# Patient Record
Sex: Female | Born: 2000 | Race: White | Hispanic: No | Marital: Single | Smoking: Never smoker
Health system: Southern US, Community
[De-identification: ages and names within clinical notes are randomized; demographics above are authoritative.]

## PROBLEM LIST (undated history)

## (undated) DIAGNOSIS — R569 Unspecified convulsions: Secondary | ICD-10-CM

## (undated) HISTORY — DX: Unspecified convulsions: R56.9

## (undated) HISTORY — PX: NO PAST SURGERIES: SHX2092

---
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
## 2021-06-09 ENCOUNTER — Encounter: Payer: Self-pay | Admitting: Neurology

## 2021-06-09 ENCOUNTER — Telehealth: Payer: Self-pay

## 2021-06-09 NOTE — Telephone Encounter (Signed)
REFERRAL FROM DR Hal Hope [phone] REFERRAL SENT TO SCHEDULING

## 2021-07-31 ENCOUNTER — Encounter: Payer: Self-pay | Admitting: Neurology

## 2021-07-31 ENCOUNTER — Ambulatory Visit: Payer: PRIVATE HEALTH INSURANCE | Admitting: Neurology

## 2021-07-31 ENCOUNTER — Other Ambulatory Visit: Payer: Self-pay

## 2021-07-31 VITALS — BP 130/67 | HR 77 | Ht 60.0 in | Wt 104.6 lb

## 2021-07-31 DIAGNOSIS — G40309 Generalized idiopathic epilepsy and epileptic syndromes, not intractable, without status epilepticus: Secondary | ICD-10-CM | POA: Diagnosis not present

## 2021-07-31 NOTE — Patient Instructions (Addendum)
Schedule MRI brain with and without contrast  2. Schedule 1-hour EEG  3. I will call you with results and we will discuss medication (Lamictal)  4. Follow-up in 3 months, call for any changes   Seizure Precautions: 1. If medication has been prescribed for you to prevent seizures, take it exactly as directed.  Do not stop taking the medicine without talking to your doctor first, even if you have not had a seizure in a long time.   2. Avoid activities in which a seizure would cause danger to yourself or to others.  Don't operate dangerous machinery, swim alone, or climb in high or dangerous places, such as on ladders, roofs, or girders.  Do not drive unless your doctor says you may.  3. If you have any warning that you may have a seizure, lay down in a safe place where you can't hurt yourself.    4.  No driving for 6 months from last seizure, as per [facility].   Please refer to the following link on the [employer]ile[employer] of [employer]'s website for more information: http://www.epilepsyfoundation.org/answerplace/Social/driving/drivingu.cfm   5.  Maintain good sleep hygiene. Avoid alcohol.  6.  Notify your neurology if you are planning pregnancy or if you become pregnant.  7.  Contact your doctor if you have any problems that may be related to the medicine you are taking.  8.  Call 911 and bring the patient back to the ED if:        A.  The seizure lasts longer than 5 minutes.       B.  The patient doesn't awaken shortly after the seizure  C.  The patient has new problems such as difficulty seeing, speaking or moving  D.  The patient was injured during the seizure  E.  The patient has a temperature over 102 F (39C)  F.  The patient vomited and now is having trouble breathing       We have sent a referral to [facility] for your MRI and they will call you directly to schedule your appointment. They are located at [address] [facility]. If you need to contact them  directly please call [phone].

## 2021-07-31 NOTE — Progress Notes (Signed)
NEUROLOGY CONSULTATION NOTE  Michele Mclaughlin MRN: [id number] DOB: 11/27/2021  Referring provider: Dr. Nadyne Coombes Primary care provider: Dr. Nadyne Coombes  Reason for consult:  syncope/seizure  Dear Dr Hal Hope:  Thank you for your kind referral of Michele Mclaughlin for consultation of the above symptoms. Although her history is well known to you, please allow me to reiterate it for the purpose of our medical record. She is alone in the office today. Records and images were personally reviewed where available.   HISTORY OF PRESENT ILLNESS: This is a pleasant 20 year old right-handed woman with a history of seizures and syncope presenting for evaluation. She was previously evaluated by pediatric neurologist Dr. Devonne Doughty for seizure-like activity. The first episode occurred in April 2019 when her mother heard a nosie from her room and found her confused and disoriented for 2 minutes, no tonic-clonic activity noted. The second episode occurred June 2019 when again her parents heard a noise and found her on the floor with tongue bite and confusion for about 2 minutes, no motor activity noted. She had another event in July 2019 where she was witnessed to have behavioral arrest then became tight and stiff, not responding so they lay her on the floor where she was noted to have stiffening more on the left arm with flexion of both upper extremities and jaw stiffening for 4-5 minutes followed by confusion. She had an EEG in July 2019 with note of 1 very brief cluster of generalized discharges in wakefulness. Repeat EEG that month showed 2 brief clusters of generalized discharges in the form of spike and slow wave activity lasting less than 1 second. She was prescribed Keppra but did not take it. She was event-free until January 2022 when she recalls taking a shower early in the morning then waking up sitting on the carpet with blood, confused. She found a gash on her head that required staples. The most  recent episode occurred on 06/19/21, she was in her bedroom then woke up in her parents' bedroom, apparently her brother found her passed out on the floor, no tongue bite or incontinence. She was a little stiff, no focal weakness, headaches, dizziness. The day prior was very stressful for her, her grandmother had passed away and her sister had given birth. She lives with her parents and 2 brothers who have not mentioned any staring/unresponsive episodes. She denies any gaps in time, olfactory/gustatory hallucinations, deja vu, rising epigastric sensation, focal numbness/tingling/weakness, myoclonic jerks.No headaches, dizziness, diplopia, dysarthria/dysphagia, neck/back pain, bowel/bladder dysfunction. Memory is pretty good. She had been evaluated by Cardiologist with plans for workup, however she is seeking a second opinion. 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   Epilepsy Risk Factors:  She had a normal birth and early development.  There is no history of febrile convulsions, CNS infections such as meningitis/encephalitis, significant traumatic brain injury, neurosurgical procedures, or family history of seizures.   PAST MEDICAL HISTORY: History reviewed. No pertinent past medical history.  PAST SURGICAL HISTORY: Past Surgical History:  Procedure Laterality Date   NO PAST SURGERIES      MEDICATIONS: Current Outpatient Medications on File Prior to Visit  Medication Sig Dispense Refill   Biotin 10 MG TABS Take by mouth.     No current facility-administered medications on file prior to visit.    ALLERGIES: No Known Allergies  FAMILY HISTORY: Family History  Problem Relation Age of Onset   Migraines Neg Hx    Seizures  Neg Hx    Autism Neg Hx    ADD / ADHD Neg Hx    Anxiety disorder Neg Hx    Depression Neg Hx    Bipolar disorder Neg Hx    Schizophrenia Neg Hx     SOCIAL HISTORY: Social History   Socioeconomic History   Marital status: Single    Spouse name: Not  on file   Number of children: Not on file   Years of education: Not on file   Highest education level: Not on file  Occupational History   Not on file  Tobacco Use   Smoking status: Never   Smokeless tobacco: Never  Substance and Sexual Activity   Alcohol use: Never   Drug use: Never   Sexual activity: Not on file  Other Topics Concern   Not on file  Social History Narrative   Lives with mom, dad, two brothers. She is going into the 11th grade and is home schooled. She enjoys baby sitting, running, and swimming   Right handed    Caffeine   Social Determinants of Health   Financial Resource Strain: Not on file  Food Insecurity: Not on file  Transportation Needs: Not on file  Physical Activity: Not on file  Stress: Not on file  Social Connections: Not on file  Intimate Partner Violence: Not on file     PHYSICAL EXAM: Vitals:   07/31/21 0855  BP: 130/67  Pulse: 77  SpO2: 99%   General: No acute distress Head:  Normocephalic/atraumatic Skin/Extremities: No rash, no edema Neurological Exam: Mental status: alert and oriented to person, place, and time, no dysarthria or aphasia, Fund of knowledge is appropriate.  Recent and remote memory are intact.  Attention and concentration are normal.     Cranial nerves: CN I: not tested CN II: pupils equal, round and reactive to light, visual fields intact CN III, IV, VI:  full range of motion, no nystagmus, no ptosis CN V: facial sensation intact CN VII: upper and lower face symmetric CN VIII: hearing intact to conversation Bulk & Tone: normal, no fasciculations. Motor: 5/5 throughout with no pronator drift. Sensation: intact to light touch, cold, pin, vibration and joint position sense.  No extinction to double simultaneous stimulation.  Romberg test negative Deep Tendon Reflexes: +2 throughout Cerebellar: no incoordination on finger to nose testing Gait: narrow-based and steady, able to tandem walk adequately. Tremor:  none   IMPRESSION: This is a 20 year old right-handed woman with a history of recurrent episodes of loss of consciousness concerning for seizures. She has had 2 prior EEGs in 2019 reporting generalized epileptiform discharges. MRI brain with and without contrast and 1-hour EEG will be ordered to classify seizures. Discussed low threshold to start medication, she would like to wait for results first before considering starting seizure medication such as Lamotrigine. [place] driving laws were discussed with the patient, and she knows to stop driving after a seizure, until 6 months seizure-free. Follow-up in 3 months, call for any changes.    Thank you for allowing me to participate in the care of this patient. Please do not hesitate to call for any questions or concerns.   Patrcia Dolly, M.D.  CC: Dr. Hal Hope

## 2021-08-13 ENCOUNTER — Ambulatory Visit: Payer: PRIVATE HEALTH INSURANCE | Admitting: Neurology

## 2021-08-13 ENCOUNTER — Other Ambulatory Visit: Payer: Self-pay

## 2021-08-13 DIAGNOSIS — G40309 Generalized idiopathic epilepsy and epileptic syndromes, not intractable, without status epilepticus: Secondary | ICD-10-CM

## 2021-08-14 ENCOUNTER — Telehealth: Payer: Self-pay | Admitting: Neurology

## 2021-08-14 NOTE — Telephone Encounter (Signed)
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 Discussed that recommendation is to start seizure medication, however she would like to wait for brain MRI first before discussing seizure medication. Will call patient back after MRI scheduled 08/16/21

## 2021-08-14 NOTE — Procedures (Signed)
ELECTROENCEPHALOGRAM REPORT  Date of Study: 08/13/2021  Patient's Name: Michele Mclaughlin MRN: [id number] Date of Birth: 05-19-2001  Referring Provider: Dr. Patrcia Dolly  Clinical History:  This is a 20 year old woman with a history of recurrent episodes of loss of consciousness with prior abnormal EEGs.   Medications: Biotin  Technical Summary: A multichannel digital 1-hour EEG recording measured by the international 10-20 system with electrodes applied with paste and impedances below 5000 ohms performed in our laboratory with EKG monitoring in an awake and asleep patient.  Hyperventilation was not performed. Photic stimulation was performed.  The digital EEG was referentially recorded, reformatted, and digitally filtered in a variety of bipolar and referential montages for optimal display.    Description: The patient is awake and asleep during the recording.  During maximal wakefulness, there is a symmetric, medium voltage 10 Hz posterior dominant rhythm that attenuates with eye opening.  The record is symmetric.  During drowsiness and sleep, there is an increase in theta slowing of the background.  Vertex waves and symmetric sleep spindles were seen.  Photic stimulation did not elicit any abnormalities.  There were no occasional bursts of generalized irregular high voltage 3-4 Hz spike and wave discharges with frontal predominance lasting 0.5-2 seconds seen. There were no electrographic seizures seen.  EKG lead was unremarkable.  Impression: This 1-hour awake and asleep EEG is abnormal due to the presence of occasional bursts of generalized irregular high voltage 3-4 Hz spike and wave discharges with frontal predominance.  Clinical Correlation of the above findings is consistent with a diagnosis of primary generalized epilepsy. If further clinical questions remain, prolonged EEG may be helpful.  Clinical correlation is advised.   Patrcia Dolly, M.D.

## 2021-08-16 ENCOUNTER — Other Ambulatory Visit: Payer: Self-pay

## 2021-08-16 ENCOUNTER — Ambulatory Visit
Admission: RE | Admit: 2021-08-16 | Discharge: 2021-08-16 | Disposition: A | Discharge status: Home / Self Care | Payer: PRIVATE HEALTH INSURANCE | Source: Ambulatory Visit | Attending: Neurology | Admitting: Neurology

## 2021-08-16 MED ORDER — GADOBENATE DIMEGLUMINE 529 MG/ML IV SOLN
10.0000 mL | Freq: Once | INTRAVENOUS | Status: AC | PRN
  Administered 2021-08-16: 10 mL via INTRAVENOUS

## 2021-08-22 ENCOUNTER — Telehealth: Payer: Self-pay | Admitting: Neurology

## 2021-08-22 NOTE — Telephone Encounter (Signed)
Pt called and informed of MRI results she stated that with normal MRI results she feels she does not need seizure medication

## 2021-08-22 NOTE — Telephone Encounter (Signed)
Pt called in wanting to get her MRI results 

## 2021-08-22 NOTE — Telephone Encounter (Signed)
Pls let her know the brain MRI was normal, no evidence of tumor, stroke, or bleed. We had discussed recommendation seizure medication after doing the MRI. Does she want to proceed? Thanks

## 2021-08-28 NOTE — Telephone Encounter (Signed)
Spoke to patient about normal MRI results and how MRI is normal in primary generalized epilepsy. Discussed EEG findings again and recommendation to start seizure medication since she has had 2 seizures this year. Discussed risks of untreated seizures. She would like to think about it, provided information about diagnosis: Primary generalized epilepsy, and recommended medication: Lamotrigine. She will talk to her parents and let me know her decision.  Would recommend f/u in October.   Annabelle Harman, I have an opening on Oct 12 at 11:30am, ok to put there. Keep Feb 2023 appt. Thanks!

## 2021-10-01 ENCOUNTER — Other Ambulatory Visit: Payer: Self-pay

## 2021-10-01 ENCOUNTER — Encounter: Payer: Self-pay | Admitting: Neurology

## 2021-10-01 ENCOUNTER — Ambulatory Visit: Payer: 59 | Admitting: Neurology

## 2021-10-01 VITALS — BP 105/68 | HR 75 | Ht 60.0 in | Wt 107.4 lb

## 2021-10-01 DIAGNOSIS — G40309 Generalized idiopathic epilepsy and epileptic syndromes, not intractable, without status epilepticus: Secondary | ICD-10-CM | POA: Diagnosis not present

## 2021-10-01 NOTE — Patient Instructions (Signed)
Good to see you! Please let me know once you are ready to start seizure medication. Follow-up in 6 months, call for any changes   Seizure Precautions 1. If medication has been prescribed for you to prevent seizures, take it exactly as directed.  Do not stop taking the medicine without talking to your doctor first, even if you have not had a seizure in a long time.   2. Avoid activities in which a seizure would cause danger to yourself or to others.  Don't operate dangerous machinery, swim alone, or climb in high or dangerous places, such as on ladders, roofs, or girders.  Do not drive unless your doctor says you may.  3. If you have any warning that you may have a seizure, lay down in a safe place where you can't hurt yourself.    4.  No driving for 6 months from last seizure, as per [facility].   Please refer to the following link on the Epilepsy Foundation of America's website for more information: http://www.epilepsyfoundation.org/answerplace/Social/driving/drivingu.cfm   5.  Maintain good sleep hygiene. Avoid alcohol  6.  Notify your neurology if you are planning pregnancy or if you become pregnant.  7.  Contact your doctor if you have any problems that may be related to the medicine you are taking.  8.  Call 911 and bring the patient back to the ED if:        A.  The seizure lasts longer than 5 minutes.       B.  The patient doesn't awaken shortly after the seizure  C.  The patient has new problems such as difficulty seeing, speaking or moving  D.  The patient was injured during the seizure  E.  The patient has a temperature over 102 F (39C)  F.  The patient vomited and now is having trouble breathing

## 2021-10-01 NOTE — Progress Notes (Signed)
NEUROLOGY FOLLOW UP OFFICE NOTE  Michele Mclaughlin [id number] 16-Jan-2001  HISTORY OF PRESENT ILLNESS: I had the pleasure of seeing Michele Mclaughlin in follow-up in the neurology clinic on 10/01/2021.  The patient was last seen 2 months ago for recurrent seizures.  She is alone in the office today. today.  Records and images were personally reviewed where available.  I personally reviewed MRI brain done 07/2021 which was normal. EEG in 07/2021 showed occasional bursts of generalized irregular high voltage 3-4 Hz spike and wave discharges with frontal predominance. I spoke to her on the phone about Primary Generalized Epilepsy and recommendation to start seizure medication, she wanted to think about it and talk to her parents. Since her last visit, she denies any seizures since 06/19/21. She has decided to hold off on starting seizure medication after talking to her parents. They would like to see how she does over the next few months. She would like to see the cardiologist as well. She denies any staring/unresponsive episodes, gaps in time, olfactory/gustatory hallucinations, focal numbness/tingling/weakness, myoclonic jerks. No headaches, dizziness, vision changes, no falls. Sleep is good.    History on Initial Assessment 07/31/2021: This is a pleasant 20 year old right-handed woman with a history of seizures and syncope presenting for evaluation. She was previously evaluated by pediatric neurologist Dr. Devonne Mclaughlin for seizure-like activity. The first episode occurred in April 2019 when her mother heard a nosie from her room and found her confused and disoriented for 2 minutes, no tonic-clonic activity noted. The second episode occurred June 2019 when again her parents heard a noise and found her on the floor with tongue bite and confusion for about 2 minutes, no motor activity noted. She had another event in July 2019 where she was witnessed to have behavioral arrest then became tight and stiff, not responding so  they lay her on the floor where she was noted to have stiffening more on the left arm with flexion of both upper extremities and jaw stiffening for 4-5 minutes followed by confusion. She had an EEG in July 2019 with note of 1 very brief cluster of generalized discharges in wakefulness. Repeat EEG that month showed 2 brief clusters of generalized discharges in the form of spike and slow wave activity lasting less than 1 second. She was prescribed Keppra but did not take it. She was event-free until January 2022 when she recalls taking a shower early in the morning then waking up sitting on the carpet with blood, confused. She found a gash on her head that required staples. The most recent episode occurred on 06/19/21, she was in her bedroom then woke up in her parents' bedroom, apparently her brother found her passed out on the floor, no tongue bite or incontinence. She was a little stiff, no focal weakness, headaches, dizziness. The day prior was very stressful for her, her grandmother had passed away and her sister had given birth. She lives with her parents and 2 brothers who have not mentioned any staring/unresponsive episodes. She denies any gaps in time, olfactory/gustatory hallucinations, deja vu, rising epigastric sensation, focal numbness/tingling/weakness, myoclonic jerks.No headaches, dizziness, diplopia, dysarthria/dysphagia, neck/back pain, bowel/bladder dysfunction. Memory is pretty good. She had been evaluated by Cardiologist with plans for workup, however she is seeking a second opinion. 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   Epilepsy Risk Factors:  She had a normal birth and early development.  There is no history of febrile convulsions, CNS infections such as  meningitis/encephalitis, significant traumatic brain injury, neurosurgical procedures, or family history of seizures.   PAST MEDICAL HISTORY: No past medical history on file.  MEDICATIONS: Current Outpatient Medications  on File Prior to Visit  Medication Sig Dispense Refill   Biotin 10 MG TABS Take by mouth.     No current facility-administered medications on file prior to visit.    ALLERGIES: No Known Allergies  FAMILY HISTORY: Family History  Problem Relation Age of Onset   Migraines Neg Hx    Seizures Neg Hx    Autism Neg Hx    ADD / ADHD Neg Hx    Anxiety disorder Neg Hx    Depression Neg Hx    Bipolar disorder Neg Hx    Schizophrenia Neg Hx     SOCIAL HISTORY: Social History   Socioeconomic History   Marital status: Single    Spouse name: Not on file   Number of children: Not on file   Years of education: Not on file   Highest education level: Not on file  Occupational History   Not on file  Tobacco Use   Smoking status: Never   Smokeless tobacco: Never  Substance and Sexual Activity   Alcohol use: Never   Drug use: Never   Sexual activity: Not on file  Other Topics Concern   Not on file  Social History Narrative   Lives with mom, dad, two brothers. She is going into the 11th grade and is home schooled. She enjoys baby sitting, running, and swimming   Right handed    Caffeine   Social Determinants of Health   Financial Resource Strain: Not on file  Food Insecurity: Not on file  Transportation Needs: Not on file  Physical Activity: Not on file  Stress: Not on file  Social Connections: Not on file  Intimate Partner Violence: Not on file     PHYSICAL EXAM: Vitals:   10/01/21 1139  BP: 105/68  Pulse: 75  SpO2: 100%   General: No acute distress Head:  Normocephalic/atraumatic Skin/Extremities: No rash, no edema Neurological Exam: alert and awake. No aphasia or dysarthria. Fund of knowledge is appropriate.  Recent and remote memory are intact.  Attention and concentration are normal.   Cranial nerves: Pupils equal, round. Extraocular movements intact with no nystagmus. Visual fields full.  No facial asymmetry.  Motor: Bulk and tone normal, muscle strength 5/5  throughout with no pronator drift.   Finger to nose testing intact.  Gait narrow-based and steady, able to tandem walk adequately.  Romberg negative.   IMPRESSION: This is a 20 yo RH woman with a history of recurrent episodes of loss of consciousness concerning for seizures. We discussed her normal brain MRI and her EEG consistent with primary generalized epilepsy, similar to prior EEGs in 2019. We discussed that she has had 2 events this year and I strongly recommend starting seizure medication. We discussed recurrence risk (70%) of seizure off medication, as well as risk for SUDEP. She expressed understanding and gratitude for the information, but at this point would like to wait. Michele Mcl[place] driving laws again discussed, no driving until 6 months seizure-free. Follow-up in 6 months, she knows to call for any changes.    Thank you for allowing me to participate in her care.  Please do not hesitate to call for any questions or concerns.    Patrcia Dolly, M.D.   CC: Dr. Hal Hope

## 2021-12-19 ENCOUNTER — Telehealth: Payer: Self-pay

## 2021-12-19 NOTE — Telephone Encounter (Signed)
NOTES SCANNED TO REFERAL 

## 2021-12-30 ENCOUNTER — Telehealth: Payer: Self-pay | Admitting: Neurology

## 2021-12-30 MED ORDER — LEVETIRACETAM 500 MG PO TABS: 500.0000 mg | ORAL_TABLET | Freq: Two times a day (BID) | ORAL | 3 refills | Status: DC

## 2021-12-30 NOTE — Progress Notes (Signed)
Cardiology Office Note   Date:  01/13/2022   ID:  SHALENA SEGRIST, DOB 2001-02-12, MRN [id number]  PCP:  Hayden Rasmussen, MD  Cardiologist:   Neasia Fleeman Martinique, MD   Chief Complaint  Patient presents with   Seizures      History of Present Illness: Michele Mclaughlin is a 21 y.o. female who is seen at the request of Dr Arbutus Leas for evaluation of syncope. She has been evaluated by Dr Delice Lesch with Neurology with documented recurrent seizures. MRI was normal. EEG 07/2021 showed occasional bursts of generalized irregular high voltage 3-4 Hz spike and wave discharges with frontal predominance. Seizure medication recommended but patient initially deferred.   She reports initial episode occurred in April 2019. Found by parents in her room confused and disoriented. Recurrent episode in June 2019 - found on floor with tongue biting and confusion for 2 minutes. Again recurrent in July 2019 with arm and jaw stiffening and not responding. Was seen by pediatric neurologist. EEG in July 2019 with note of 1 very brief cluster of generalized discharges in wakefulness. Repeat EEG that month showed 2 brief clusters of generalized discharges in the form of spike and slow wave activity lasting less than 1 second. She was prescribed Keppra but did not take it.  She had recurrent episode in Jan 2022 when taking a shower. Awoke confused with a gash on her head requiring staples. Another episode on June 19, 2021 found in parent's bedroom passed out with some stiffness. She was evaluated by Cardiology at [facility] then. Echo and event monitor recommended but this was never done.   She reports while on vacation this month she had another episode in [place]. Was seen in the ED there. Agreed to start [place] at that time. Note she has no warning of episodes. Always has confusion afterwards for 5-10 minutes. Does not drive. Notes father with history of thoracic aneurysm and Afib.   Past Medical History:  Diagnosis Date    Generalized seizures ([place])     Past Surgical History:  Procedure Laterality Date   NO PAST SURGERIES       Current Outpatient Medications  Medication Sig Dispense Refill   Biotin 10 MG TABS Take by mouth.     levETIRAcetam (KEPPRA) 500 MG tablet Take 1 tablet (500 mg total) by mouth 2 (two) times daily. 60 tablet 3   norethindrone-ethinyl estradiol (LOESTRIN) 1-20 MG-MCG tablet Take 1 tablet by mouth daily.     No current facility-administered medications for this visit.    Allergies:   Patient has no known allergies.    Social History:  The patient  reports that she has never smoked. She has never used smokeless tobacco. She reports that she does not drink alcohol and does not use drugs.   Family History:  The patient's family history includes Aortic aneurysm in her father; Arrhythmia in her father; Hypertension in her father.    ROS:  Please see the history of present illness.   Otherwise, review of systems are positive for none.   All other systems are reviewed and negative.    PHYSICAL EXAM: VS:  BP 104/62    Pulse 68    Ht 5' (1.524 m)    Wt 104 lb 9.6 oz (47.4 kg)    SpO2 98%    BMI 20.43 kg/m  , BMI Body mass index is 20.43 kg/m. GEN: Well nourished, well developed, in no acute distress HEENT: normal Neck: no JVD, carotid bruits, or  masses Cardiac: RRR; no murmurs, rubs, or gallops,no edema  Respiratory:  clear to auscultation bilaterally, normal work of breathing GI: soft, nontender, nondistended, + BS MS: no deformity or atrophy Skin: warm and dry, no rash Neuro:  Strength and sensation are intact Psych: euthymic mood, full affect   EKG:  EKG is ordered today. The ekg ordered today demonstrates NSR rate 68. Normal. I have personally reviewed and interpreted this study.    Recent Labs: No results found for requested labs within last 8760 hours.    Lipid Panel No results found for: CHOL, TRIG, HDL, CHOLHDL, VLDL, LDLCALC, LDLDIRECT    Wt Readings from  Last 3 Encounters:  01/13/22 104 lb 9.6 oz (47.4 kg)  10/01/21 107 lb 6.4 oz (48.7 kg) (11 %, Z= -1.22)*  07/31/21 104 lb 9.6 oz (47.4 kg) (8 %, Z= -1.43)*   * Growth percentiles are based on CDC (Girls, 2-20 Years) data.      Other studies Reviewed: Additional studies/ records that were reviewed today include: see HPI. Review of the above records demonstrates: none   ASSESSMENT AND PLAN:  1.  Generalized seizure disorder. Based on history and results of EEG I think it is clear that she is having seizures. Her cardiac exam and Ecg are normal. I really don't think further cardiac evaluation is warranted since we do have a reason for her spells. Any further cardiac evaluation is going to have an extremely low yield. I encouraged her to follow up with Neurology and I will see as needed.    Current medicines are reviewed at length with the patient today.  The patient does not have concerns regarding medicines.  The following changes have been made:  no change  Labs/ tests ordered today include:  No orders of the defined types were placed in this encounter.        Disposition:   FU with me PRN  Signed, Kodi Guerrera Martinique, MD  01/13/2022 9:11 AM    [facility] [address], [place], [place], [address] Phone [phone], Fax [phone]

## 2021-12-30 NOTE — Addendum Note (Signed)
Addended by: Jake Seats on: 12/30/2021 03:45 PM   Modules accepted: Orders

## 2021-12-30 NOTE — Telephone Encounter (Signed)
Pt called an informed she and Dr Karel Jarvis had previously discussed my recommendation to start seizure medication, Dr Karel Jarvis agrees with Keppra 500mg  BID, pls send in refills until her appt in April. Pls remind her that seizure triggers include missing medication, poor sleep, and alcohol. Also, no driving until 6 months seizure-free. Pt asking if taken Keppra with birth control is ok? Dr May was asked and she stated it it was fine to take together,

## 2021-12-30 NOTE — Telephone Encounter (Signed)
Patient is on vacation in [place], they started her on keppra 500mg  and gave her 30 days worth. They told her to see her doctor asap. She comes back home tomorrow, she wants to know if she needs to be seen earlier than April to be put on medication.

## 2021-12-30 NOTE — Telephone Encounter (Signed)
We had previously discussed my recommendation to start seizure medication, I agree with Keppra 500mg  BID, pls send in refills until her appt in April. Pls remind her that seizure triggers include missing medication, poor sleep, and alcohol. Also, no driving until 6 months seizure-free. Thanks

## 2022-01-07 ENCOUNTER — Telehealth: Payer: Self-pay | Admitting: Neurology

## 2022-01-07 NOTE — Telephone Encounter (Signed)
Patient called is on [place], had a seizure while out of town.  Called to see if she needs another refil for the medication.

## 2022-01-07 NOTE — Telephone Encounter (Signed)
Pt advised that Levetiracetam is at the pharmacy she can call to have it refilled when she is down to a week to a week in a half to get it refilled

## 2022-01-13 ENCOUNTER — Other Ambulatory Visit: Payer: Self-pay

## 2022-01-13 ENCOUNTER — Ambulatory Visit (INDEPENDENT_AMBULATORY_CARE_PROVIDER_SITE_OTHER): Payer: 59 | Admitting: Cardiology

## 2022-01-13 ENCOUNTER — Encounter: Payer: Self-pay | Admitting: Cardiology

## 2022-01-13 VITALS — BP 104/62 | HR 68 | Ht 60.0 in | Wt 104.6 lb

## 2022-01-13 DIAGNOSIS — R569 Unspecified convulsions: Secondary | ICD-10-CM

## 2022-02-11 ENCOUNTER — Ambulatory Visit: Payer: PRIVATE HEALTH INSURANCE | Admitting: Neurology

## 2022-04-07 ENCOUNTER — Ambulatory Visit: Payer: 59 | Admitting: Neurology

## 2022-04-07 ENCOUNTER — Encounter: Payer: Self-pay | Admitting: Neurology

## 2022-04-07 VITALS — BP 112/65 | HR 95 | Ht 60.0 in | Wt 103.0 lb

## 2022-04-07 DIAGNOSIS — G40309 Generalized idiopathic epilepsy and epileptic syndromes, not intractable, without status epilepticus: Secondary | ICD-10-CM

## 2022-04-07 MED ORDER — LEVETIRACETAM 500 MG PO TABS: 500.0000 mg | ORAL_TABLET | Freq: Two times a day (BID) | ORAL | 3 refills | Status: DC

## 2022-04-07 NOTE — Progress Notes (Signed)
? ?NEUROLOGY FOLLOW UP OFFICE NOTE ? ?Michele Mclaughlin ?[id number] ?2001/02/23 ? ?HISTORY OF PRESENT ILLNESS: ?I had the pleasure of seeing Michele Mclaughlin in follow-up in the neurology clinic on 04/07/2022.  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 She is alone in the office today. Records and images were personally reviewed where available. She had seen Cardiology with no further workup recommended. On her last visit, she decided to hold off on seizure medication, however after having another seizure while in [place] on 12/25/2021, she started Levetiracetam 500mg  BID. They had been travelling all night and got to [place] at [place]. She slept, then recalls waking up and showering, then waking up on the bathroom floor. Her brother heard noises in the bathroom. She had bit her tongue and hit the right eye so they went to the ER where she had a head CT and was discharged home on Levetiracetam 500mg  BID. She denies any side effects on the medication. No seizures since 12/25/2021. She denies any staring/unresponsive episodes, gaps in time, olfactory/gustatory hallucinations, focal numbness/tingling/weakness, myoclonic jerks. No headaches, dizziness, vision changes, no falls. She gets 7 hours of sleep. She feels irritable when she has not eaten but otherwise mood is good. 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  ? ? ? ?History on Initial Assessment 07/31/2021: This is a pleasant 21 year old right-handed woman with a history of seizures and syncope presenting for evaluation. She was previously evaluated by pediatric neurologist Dr. [name] for seizure-like activity. The first episode occurred in April 2019 when her mother heard a nosie from her room and found her confused and disoriented for 2 minutes, no tonic-clonic activity noted. The second episode occurred June 2019 when again her parents heard a noise and found her on the floor with tongue bite and confusion for  about 2 minutes, no motor activity noted. She had another event in July 2019 where she was witnessed to have behavioral arrest then became tight and stiff, not responding so they lay her on the floor where she was noted to have stiffening more on the left arm with flexion of both upper extremities and jaw stiffening for 4-5 minutes followed by confusion. She had an EEG in July 2019 with note of 1 very brief cluster of generalized discharges in wakefulness. Repeat EEG that month showed 2 brief clusters of generalized discharges in the form of spike and slow wave activity lasting less than 1 second. She was prescribed Keppra but did not take it. She was event-free until January 2022 when she recalls taking a shower early in the morning then waking up sitting on the carpet with blood, confused. She found a gash on her head that required staples. The most recent episode occurred on 06/19/21, she was in her bedroom then woke up in her parents' bedroom, apparently her brother found her passed out on the floor, no tongue bite or incontinence. She was a little stiff, no focal weakness, headaches, dizziness. The day prior was very stressful for her, her grandmother had passed away and her sister had given birth. She lives with her parents and 2 brothers who have not mentioned any staring/unresponsive episodes. She denies any gaps in time, olfactory/gustatory hallucinations, deja vu, rising epigastric sensation, focal numbness/tingling/weakness, myoclonic jerks.No headaches, dizziness, diplopia, dysarthria/dysphagia, neck/back pain, bowel/bladder dysfunction. Memory is pretty good. She had been evaluated by Cardiologist with plans for workup, however she is seeking a second opinion. 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  ? ?Epilepsy Risk Factors:  She had a normal birth and early development.  There is no history of febrile convulsions, CNS infections such as meningitis/encephalitis, significant traumatic brain  injury, neurosurgical procedures, or family history of seizures. ? ?Diagnostic Data: ?MRI brain with and without contrast done 07/2021 normal, hippocampi symmetric with no abnormal signal or enhancement. ? ?EEG in 07/2021 showed occasional bursts of generalized irregular high voltage 3-4 Hz spike and wave discharges with frontal predominance.  ? ?PAST MEDICAL HISTORY: ?Past Medical History:  ?Diagnosis Date  ? Generalized seizures ([place])   ? ? ?MEDICATIONS: ?Current Outpatient Medications on File Prior to Visit  ?Medication Sig Dispense Refill  ? levETIRAcetam (KEPPRA) 500 MG tablet Take 1 tablet (500 mg total) by mouth 2 (two) times daily. 60 tablet 3  ? norethindrone-ethinyl estradiol (LOESTRIN) 1-20 MG-MCG tablet Take 1 tablet by mouth daily.    ? Biotin 10 MG TABS Take by mouth. (Patient not taking: Reported on 04/07/2022)    ? ?No current facility-administered medications on file prior to visit.  ? ? ?ALLERGIES: ?No Known Allergies ? ?FAMILY HISTORY: ?Family History  ?Problem Relation Age of Onset  ? Hypertension Father   ? Arrhythmia Father   ?     Afib  ? Aortic aneurysm Father   ? Migraines Neg Hx   ? Seizures Neg Hx   ? Autism Neg Hx   ? ADD / ADHD Neg Hx   ? Anxiety disorder Neg Hx   ? Depression Neg Hx   ? Bipolar disorder Neg Hx   ? Schizophrenia Neg Hx   ? ? ?SOCIAL HISTORY: ?Social History  ? ?Socioeconomic History  ? Marital status: Single  ?  Spouse name: Not on file  ? Number of children: Not on file  ? Years of education: Not on file  ? Highest education level: Not on file  ?Occupational History  ? Not on file  ?Tobacco Use  ? Smoking status: Never  ? Smokeless tobacco: Never  ?Substance and Sexual Activity  ? Alcohol use: Never  ? Drug use: Never  ? Sexual activity: Not on file  ?Other Topics Concern  ? Not on file  ?Social History Narrative  ? Lives with mom, dad, two brothers. She is going into the 11th grade and is home schooled. She enjoys baby sitting, running, and swimming  ? Right handed   ?  Caffeine  ? ?Social Determinants of Health  ? ?Financial Resource Strain: Not on file  ?Food Insecurity: Not on file  ?Transportation Needs: Not on file  ?Physical Activity: Not on file  ?Stress: Not on file  ?Social Connections: Not on file  ?Intimate Partner Violence: Not on file  ? ? ? ?PHYSICAL EXAM: ?Vitals:  ? 04/07/22 0843  ?BP: 112/65  ?Pulse: 95  ?SpO2: 99%  ? ?General: No acute distress ?Head:  Normocephalic/atraumatic ?Skin/Extremities: No rash, no edema ?Neurological Exam: alert and awake. No aphasia or dysarthria. Fund of knowledge is appropriate.  Attention and concentration are normal.   Cranial nerves: Pupils equal, round. Extraocular movements intact with no nystagmus. Visual fields full.  No facial asymmetry.  Motor: Bulk and tone normal, muscle strength 5/5 throughout with no pronator drift.   Finger to nose testing intact.  Gait narrow-based and steady, able to tandem walk adequately.  Romberg negative. ? ? ?IMPRESSION: ?This is a very pleasant 21 yo RH woman with primary generalized epilepsy. She is now on Levetiracetam 500mg   BID with no seizures since 12/25/2021, no side effects. We discussed diagnosis and prognosis, as well as avoidance of seizure triggers, including missing medication, sleep deprivation, and alcohol. No pregnancy plans. She is aware of  driving laws to stop driving until 6 months seizure-free. Follow-up in 6 months, call for any changes.  ? ?Thank you for allowing me to participate in her care.  Please do not hesitate to call for any questions or concerns. ? ? ? ?Ellouise Newer, M.D. ? ? ?CC: Dr. Darron Doom ?

## 2022-04-07 NOTE — Patient Instructions (Signed)
Good to see you! Continue Keppra (Levetiracetam) 500mg  twice a day. Follow-up in 6 months, call for any changes. ? ? ?Seizure Precautions: ?1. If medication has been prescribed for you to prevent seizures, take it exactly as directed.  Do not stop taking the medicine without talking to your doctor first, even if you have not had a seizure in a long time.  ? ?2. Avoid activities in which a seizure would cause danger to yourself or to others.  Don't operate dangerous machinery, swim alone, or climb in high or dangerous places, such as on ladders, roofs, or girders.  Do not drive unless your doctor says you may. ? ?3. If you have any warning that you may have a seizure, lay down in a safe place where you can't hurt yourself.   ? ?4.  No driving for 6 months from last seizure, as per [facility].   Please refer to the following link on the Epilepsy Foundation of America's website for more information: http://www.epilepsyfoundation.org/answerplace/Social/driving/drivingu.cfm  ? ?5.  Maintain good sleep hygiene. Avoid alcohol. ? ?6.  Notify your neurology if you are planning pregnancy or if you become pregnant. ? ?7.  Contact your doctor if you have any problems that may be related to the medicine you are taking. ? ?8.  Call 911 and bring the patient back to the ED if: ?      ? A.  The seizure lasts longer than 5 minutes.      ? B.  The patient doesn't awaken shortly after the seizure ? C.  The patient has new problems such as difficulty seeing, speaking or moving ? D.  The patient was injured during the seizure ? E.  The patient has a temperature over 102 F (39C) ? F.  The patient vomited and now is having trouble breathing ?      ? ?

## 2022-10-07 ENCOUNTER — Encounter: Payer: Self-pay | Admitting: Neurology

## 2022-10-07 ENCOUNTER — Telehealth: Payer: Commercial Managed Care - HMO | Admitting: Neurology

## 2022-10-07 VITALS — BP 108/71 | HR 74 | Ht 61.0 in | Wt 106.8 lb

## 2022-10-07 DIAGNOSIS — G40309 Generalized idiopathic epilepsy and epileptic syndromes, not intractable, without status epilepticus: Secondary | ICD-10-CM | POA: Diagnosis not present

## 2022-10-07 MED ORDER — LEVETIRACETAM 500 MG PO TABS: 500.0000 mg | ORAL_TABLET | Freq: Two times a day (BID) | ORAL | 3 refills | Status: DC

## 2022-10-07 NOTE — Progress Notes (Signed)
NEUROLOGY FOLLOW UP OFFICE NOTE  ETHLYN Mclaughlin [id number] September 05, 2001  HISTORY OF PRESENT ILLNESS: I had the pleasure of seeing Michele Mclaughlin in follow-up in the neurology clinic on 10/07/2022.  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 She is alone in the office today. Records and images were personally reviewed where available.  She denies any seizures since 12/25/2021 on Levetiracetam 500mg  BID, no side effects. She denies any staring/unresponsive episodes, gaps in time, olfactory/gustatory hallucinations, focal numbness/tingling/weakness, myoclonic jerks. No headaches, dizziness, vision changes, no falls. She usually tries to get 7 hours of sleep. Mood is good. She is driving. No pregnancy plans, she is on birth control pills.    History on Initial Assessment 07/31/2021: This is a pleasant 21 year old right-handed woman with a history of seizures and syncope presenting for evaluation. She was previously evaluated by pediatric neurologist Dr. Jordan Mclaughlin for seizure-like activity. The first episode occurred in April 2019 when her mother heard a nosie from her room and found her confused and disoriented for 2 minutes, no tonic-clonic activity noted. The second episode occurred June 2019 when again her parents heard a noise and found her on the floor with tongue bite and confusion for about 2 minutes, no motor activity noted. She had another event in July 2019 where she was witnessed to have behavioral arrest then became tight and stiff, not responding so they lay her on the floor where she was noted to have stiffening more on the left arm with flexion of both upper extremities and jaw stiffening for 4-5 minutes followed by confusion. She had an EEG in July 2019 with note of 1 very brief cluster of generalized discharges in wakefulness. Repeat EEG that month showed 2 brief clusters of generalized discharges in the form of spike and slow wave activity lasting less than 1 second.  She was prescribed Keppra but did not take it. She was event-free until January 2022 when she recalls taking a shower early in the morning then waking up sitting on the carpet with blood, confused. She found a gash on her head that required staples. The most recent episode occurred on 06/19/21, she was in her bedroom then woke up in her parents' bedroom, apparently her brother found her passed out on the floor, no tongue bite or incontinence. She was a little stiff, no focal weakness, headaches, dizziness. The day prior was very stressful for her, her grandmother had passed away and her sister had given birth. She lives with her parents and 2 brothers who have not mentioned any staring/unresponsive episodes. She denies any gaps in time, olfactory/gustatory hallucinations, deja vu, rising epigastric sensation, focal numbness/tingling/weakness, myoclonic jerks.No headaches, dizziness, diplopia, dysarthria/dysphagia, neck/back pain, bowel/bladder dysfunction. Memory is pretty good. She had been evaluated by Cardiologist with plans for workup, however she is seeking a second opinion. 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   Epilepsy Risk Factors:  She had a normal birth and early development.  There is no history of febrile convulsions, CNS infections such as meningitis/encephalitis, significant traumatic brain injury, neurosurgical procedures, or family history of seizures.  Diagnostic Data: MRI brain with and without contrast done 07/2021 normal, hippocampi symmetric with no abnormal signal or enhancement.  EEG in 07/2021 showed occasional bursts of generalized irregular high voltage 3-4 Hz spike and wave discharges with frontal predominance.   PAST MEDICAL HISTORY: Past Medical History:  Diagnosis Date   Generalized seizures ([place])  MEDICATIONS: Current Outpatient Medications on File Prior to Visit  Medication Sig Dispense Refill   Biotin 10 MG TABS Take by mouth.     levETIRAcetam  (KEPPRA) 500 MG tablet Take 1 tablet (500 mg total) by mouth 2 (two) times daily. 180 tablet 3   Multiple Vitamins-Minerals (ONE-A-DAY WOMENS PO) Take by mouth.     norethindrone-ethinyl estradiol (LOESTRIN) 1-20 MG-MCG tablet Take 1 tablet by mouth daily.     No current facility-administered medications on file prior to visit.    ALLERGIES: No Known Allergies  FAMILY HISTORY: Family History  Problem Relation Age of Onset   Hypertension Father    Arrhythmia Father        Afib   Aortic aneurysm Father    Migraines Neg Hx    Seizures Neg Hx    Autism Neg Hx    ADD / ADHD Neg Hx    Anxiety disorder Neg Hx    Depression Neg Hx    Bipolar disorder Neg Hx    Schizophrenia Neg Hx     SOCIAL HISTORY: Social History   Socioeconomic History   Marital status: Single    Spouse name: Not on file   Number of children: Not on file   Years of education: Not on file   Highest education level: Not on file  Occupational History   Not on file  Tobacco Use   Smoking status: Never   Smokeless tobacco: Never  Vaping Use   Vaping Use: Never used  Substance and Sexual Activity   Alcohol use: Never   Drug use: Never   Sexual activity: Not on file  Other Topics Concern   Not on file  Social History Narrative   Lives with mom, dad, two brothers. She is going into the 11th grade and is home schooled. She enjoys baby sitting, running, and swimming   Right handed    Caffeine   Has 5 brothers and one sister   Social Determinants of Health   Financial Resource Strain: Not on file  Food Insecurity: Not on file  Transportation Needs: Not on file  Physical Activity: Not on file  Stress: Not on file  Social Connections: Not on file  Intimate Partner Violence: Not on file     PHYSICAL EXAM: Vitals:   10/07/22 0959  BP: 108/71  Pulse: 74  SpO2: 100%   General: No acute distress Head:  Normocephalic/atraumatic Skin/Extremities: No rash, no edema Neurological Exam: alert and  awake. No aphasia or dysarthria. Fund of knowledge is appropriate. Attention and concentration are normal.   Cranial nerves: Pupils equal, round. Extraocular movements intact with no nystagmus. Visual fields full.  No facial asymmetry.  Motor: Bulk and tone normal, muscle strength 5/5 throughout with no pronator drift.   Finger to nose testing intact.  Gait narrow-based and steady, able to tandem walk adequately.  Romberg negative.   IMPRESSION: This is a very pleasant 21 yo RH woman with primary generalized epilepsy. She is doing well on Levetiracetam 500mg  BID, no seizures since 12/25/2021, refills sent. We again discussed avoidance of seizure triggers, including missing medication, sleep deprivation, and alcohol. She is aware of [place] driving laws to stop driving after a seizure until 6 months seizure-free. Follow-up in 8 months, call for any changes.   Thank you for allowing me to participate in her care.  Please do not hesitate to call for any questions or concerns.   Ellouise Newer, M.D.   CC: Dr. Darron Doom

## 2022-10-07 NOTE — Patient Instructions (Signed)
Good to see you! Continue Levetiracetam 500mg  twice a day. Follow-up in 8 months, call for any changes.   Seizure Precautions: 1. If medication has been prescribed for you to prevent seizures, take it exactly as directed.  Do not stop taking the medicine without talking to your doctor first, even if you have not had a seizure in a long time.   2. Avoid activities in which a seizure would cause danger to yourself or to others.  Don't operate dangerous machinery, swim alone, or climb in high or dangerous places, such as on ladders, roofs, or girders.  Do not drive unless your doctor says you may.  3. If you have any warning that you may have a seizure, lay down in a safe place where you can't hurt yourself.    4.  No driving for 6 months from last seizure, as per [facility].   Please refer to the following link on the [place] website for more information: http://[email or web address]/[email or web address]/driving/drivingu.cfm   5.  Maintain good sleep hygiene. Avoid alcohol.  6.  Notify your neurology if you are planning pregnancy or if you become pregnant.  7.  Contact your doctor if you have any problems that may be related to the medicine you are taking.  8.  Call 911 and bring the patient back to the ED if:        A.  The seizure lasts longer than 5 minutes.       B.  The patient doesn't awaken shortly after the seizure  C.  The patient has new problems such as difficulty seeing, speaking or moving  D.  The patient was injured during the seizure  E.  The patient has a temperature over 102 F (39C)  F.  The patient vomited and now is having trouble breathing

## 2022-12-24 ENCOUNTER — Ambulatory Visit: Payer: Commercial Managed Care - HMO | Admitting: Podiatry

## 2022-12-24 ENCOUNTER — Ambulatory Visit (INDEPENDENT_AMBULATORY_CARE_PROVIDER_SITE_OTHER): Payer: Commercial Managed Care - HMO

## 2022-12-24 DIAGNOSIS — M21611 Bunion of right foot: Secondary | ICD-10-CM | POA: Diagnosis not present

## 2022-12-24 DIAGNOSIS — M21612 Bunion of left foot: Secondary | ICD-10-CM | POA: Diagnosis not present

## 2022-12-24 DIAGNOSIS — Z9889 Other specified postprocedural states: Secondary | ICD-10-CM

## 2022-12-24 NOTE — Progress Notes (Signed)
Subjective:  Patient ID: Michele Mclaughlin, female    DOB: Jun 13, 2001,  MRN: [id number]  Chief Complaint  Patient presents with   Bunions    Bilateral feet,     22 y.o. female presents with the above complaint.  Patient presents with concern for bilateral painful bunions.  She says the left foot is worsening and beginning to cause her some discomfort with wearing certain types of shoes.  She is not able to wear the shoes anymore due to the widening of the forefoot on the left foot especially.  She does the right foot does not hurt her as bad at this time.  She does have a family history of having bunions and wanted to get them checked out to see how he can be done.  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  Review of Systems: Negative except as noted in the HPI. Denies N/V/F/Ch.  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 There is no height or weight on file to calculate BMI. Constitutional Well developed. Well nourished.  Vascular Dorsalis pedis pulses palpable bilaterally. Posterior tibial pulses palpable bilaterally. Capillary refill normal to all digits.  No cyanosis or clubbing noted. Pedal hair growth normal.  Neurologic Normal speech. Oriented to person, place, and time. Epicritic  sensation to light touch grossly present bilaterally.  Dermatologic Nails well groomed and normal in appearance. No open wounds. No skin lesions.  Orthopedic: Normal joint ROM without pain or crepitus bilaterally. Hallux abductovalgus deformity present - left worse than right .  Mild pain with palpation about the medial and lateral forefoot of the left. Attention directed to the fifth metatarsal there is noted to be prominence laterally at the fifth metatarsal head.  Tailor's bunion deformity is present.  Hallux is in valgus position and is abutting the second toe. Left 1st MPJ full range of motion. Left 1st TMT without gross hypermobility. Right 1st MPJ full range of motion  Right 1st TMT without gross hypermobility. Lesser digital contractures absent bilaterally.   Radiographs:  3 views bilateral foot AP lateral oblique 12/24/22 Taken and reviewed. Hallux abductovalgus deformity present. Metatarsal parabola normal. Left foot 1st/2nd IMA: 13 degrees; TSP: 3-4.  Attention directed to the left foot fourth the fifth intermetatarsal space angle is increased approx 16 degrees with increased lateral deviation of the distal portion of the fifth metatarsal head.  Assessment:   1. Bunion of left foot   2. Bunion of right foot   3. Bilateral bunions    Plan:  Patient was evaluated and treated and all questions answered.  #Hallux abductovalgus deformity, Left foot # Tailor's bunion left foot -XR as above. - Recommend MIS bunionectomy approach and patient is agreeable. No appreciable  hypermobility of the 1st ray to necessitate 1st TMTJ arthrodesis. P -Patient has failed all conservative therapy and wishes to proceed with surgical intervention. All risks, benefits, and alternatives discussed with patient. No guarantees given. Consent reviewed and signed by patient. Post-op course explained at length.  Patient would like to proceed with surgical intervention. -Planned procedures: Left foot  bunionectomy with distal first metatarsal osteotomy and possible Akin osteotomy as needed for correction.  Also plan for tailor's bunionectomy with distal fifth metatarsal osteotomy with possible fixation. -Discussed the risk benefits alternatives to surgical intervention.  Patient could wait and delay surgery however this problem may likely progress in the future especially on the left side. -Risk factors: Patient is without significant surgical risk factors  Return for after OR.

## 2022-12-25 ENCOUNTER — Telehealth: Payer: Self-pay | Admitting: Podiatry

## 2022-12-25 NOTE — Telephone Encounter (Signed)
DOS: 01/20/2023  [employer] Effective 12/21/2022  Double Osteotomy Lt (29528) Tailors Bunionectomy Lt [phone])  Deductible: $0 Out-of-Pocket: $1,800 with $0 met CoInsurance: 25%  Prior authorization is not required per [employer].  Reference #: [id number]

## 2023-01-11 ENCOUNTER — Telehealth: Payer: Self-pay | Admitting: Urology

## 2023-01-11 NOTE — Telephone Encounter (Signed)
Pt called stating she needs to cxl her sx due to school and insurance she will call back to reschedule. I have informed Dr. Loel Lofty and Caren Griffins with [employer] with this change.

## 2023-01-21 ENCOUNTER — Other Ambulatory Visit: Payer: Commercial Managed Care - HMO

## 2023-01-28 ENCOUNTER — Encounter: Payer: Commercial Managed Care - HMO | Admitting: Podiatry

## 2023-02-16 ENCOUNTER — Other Ambulatory Visit (INDEPENDENT_AMBULATORY_CARE_PROVIDER_SITE_OTHER): Payer: Commercial Managed Care - HMO | Admitting: Podiatry

## 2023-02-16 DIAGNOSIS — Z9889 Other specified postprocedural states: Secondary | ICD-10-CM

## 2023-02-16 MED ORDER — CEPHALEXIN 500 MG PO CAPS: 500.0000 mg | ORAL_CAPSULE | Freq: Three times a day (TID) | ORAL | 0 refills | Status: AC

## 2023-02-16 MED ORDER — ASPIRIN 81 MG PO TBEC: 81.0000 mg | DELAYED_RELEASE_TABLET | Freq: Two times a day (BID) | ORAL | 0 refills | Status: AC

## 2023-02-16 MED ORDER — OXYCODONE-ACETAMINOPHEN 5-325 MG PO TABS: 1.0000 | ORAL_TABLET | ORAL | 0 refills | Status: AC | PRN

## 2023-02-16 NOTE — Progress Notes (Signed)
Post-op meds sent

## 2023-02-17 ENCOUNTER — Encounter: Payer: Self-pay | Admitting: Podiatry

## 2023-02-17 DIAGNOSIS — M2012 Hallux valgus (acquired), left foot: Secondary | ICD-10-CM | POA: Diagnosis not present

## 2023-02-25 ENCOUNTER — Ambulatory Visit: Payer: Commercial Managed Care - HMO

## 2023-02-25 ENCOUNTER — Ambulatory Visit (INDEPENDENT_AMBULATORY_CARE_PROVIDER_SITE_OTHER): Payer: Commercial Managed Care - HMO | Admitting: Podiatry

## 2023-02-25 DIAGNOSIS — Z09 Encounter for follow-up examination after completed treatment for conditions other than malignant neoplasm: Secondary | ICD-10-CM

## 2023-02-25 DIAGNOSIS — Z9889 Other specified postprocedural states: Secondary | ICD-10-CM

## 2023-02-25 DIAGNOSIS — M21612 Bunion of left foot: Secondary | ICD-10-CM

## 2023-02-25 NOTE — Progress Notes (Signed)
  Subjective:  Patient ID: Michele Mclaughlin, female    DOB: February 09, 2001,  MRN: [id number]  Chief Complaint  Patient presents with   Routine Post Op    POV #1 DOS 02/17/2023 LT FOOT BUNIONECTOMY W/DISTAL 1ST METATARSAL & [name], TAILOR BUNIONECOTOMY LT FOOT    DOS: 02/17/2023 Procedure: Left foot bunionectomy with distal first metatarsal osteotomy and tailor's bunionectomy with distal fifth metatarsal osteotomy  22 y.o. female returns for post-op check.  Patient presents for first postoperative check status post left foot bunionectomy with distal first metatarsal osteotomy and tailor's bunionectomy with distal fifth metatarsal osteotomy done via minimally invasive approach.  She is now approximately 8 days postop.  She is doing very well at this time she has very little to no pain.  She has maintained nonweightbearing status using crutches and is in a cam boot currently.  Has kept the dressing clean dry intact since surgery  Review of Systems: Negative except as noted in the HPI. Denies N/V/F/Ch.   Objective:  There were no vitals filed for this visit. There is no height or weight on file to calculate BMI. Constitutional Well developed. Well nourished.  Vascular Foot warm and well perfused. Capillary refill normal to all digits.  Calf is soft and supple, no posterior calf or knee pain, negative Homans' sign  Neurologic Normal speech. Oriented to person, place, and time. Epicritic sensation to light touch grossly present bilaterally.  Dermatologic Skin healing well without signs of infection. Skin edges well coapted without signs of infection.  Orthopedic: Tenderness to palpation noted about the surgical site.   Multiple view plain film radiographs: 02/25/2023 XR 3 views AP lateral oblique nonweightbearing left foot.  Findings: Attention directed distal first metatarsal osteotomy has been made at the distal first met neck with fixation of the 2 cannulated fully threaded screws.  There is  good correction of the bunionectomy with decreased prominence at the medial eminence.  On the lateral aspect of the foot there is been osteotomy of the fifth metatarsal neck area fixated with 1 surgical fully threaded screw.  No change in displacement or appearance of the hardware or osteotomies from the immediate postoperative films.  Hallux valgus angle is reduced with the hallux in rectus alignment with the first metatarsal head and the second toe Assessment:   1. Postoperative examination   2. Post-operative state   3. Bunion of left foot    Plan:  Patient was evaluated and treated and all questions answered.  S/p foot surgery left foot bunionectomy and tailor's bunionectomy -Progressing very well post-operatively.  Patient denies any pain and there is no evidence of postoperative complication she is doing well immediately postop -XR: As above no acute postoperative complication -WB Status: Nonweightbearing in cam boot -Sutures: To remain intact until next visit. -Medications: Tylenol or ibuprofen as needed for pain control -Foot redressed.  Return in about 1 week (around 03/04/2023).         Everitt Amber, DPM [facility] [place]

## 2023-03-04 ENCOUNTER — Ambulatory Visit (INDEPENDENT_AMBULATORY_CARE_PROVIDER_SITE_OTHER): Payer: Commercial Managed Care - HMO | Admitting: Podiatry

## 2023-03-04 ENCOUNTER — Encounter: Payer: Commercial Managed Care - HMO | Admitting: Podiatry

## 2023-03-04 DIAGNOSIS — Z9889 Other specified postprocedural states: Secondary | ICD-10-CM

## 2023-03-04 DIAGNOSIS — Z09 Encounter for follow-up examination after completed treatment for conditions other than malignant neoplasm: Secondary | ICD-10-CM

## 2023-03-04 DIAGNOSIS — M21612 Bunion of left foot: Secondary | ICD-10-CM

## 2023-03-04 NOTE — Progress Notes (Signed)
  Subjective:  Patient ID: Michele Mclaughlin, female    DOB: [birth year],  MRN: [id number]  Chief Complaint  Patient presents with   Routine Post Op    Patient states that she has toe cramps from time to time.     DOS: 02/17/2023 Procedure: Left foot bunionectomy with distal first metatarsal osteotomy and tailor's bunionectomy with distal fifth metatarsal osteotomy  22 y.o. female returns for second post-op check.  Patient presents for second postoperative check status post left foot bunionectomy with distal first metatarsal osteotomy and tailor's bunionectomy with distal fifth metatarsal osteotomy done via minimally invasive approach.  She is now approximately 2 weeks postop.  She is doing very well at this time she has very little to no pain similar to last visit.  She has maintained nonweightbearing status using crutches and is in a cam boot currently.  Has kept the dressing clean dry intact since last appointment.  No concerns from the patient she is doing very well since her last visit.  Review of Systems: Negative except as noted in the HPI. Denies N/V/F/Ch.   Objective:  There were no vitals filed for this visit. There is no height or weight on file to calculate BMI. Constitutional Well developed. Well nourished.  Vascular Foot warm and well perfused. Capillary refill normal to all digits.  Calf is soft and supple, no posterior calf or knee pain, negative Homans' sign  Neurologic Normal speech. Oriented to person, place, and time. Epicritic sensation to light touch grossly present bilaterally.  Dermatologic Skin incisions well-healed along the medial and lateral aspect of the left foot  Orthopedic: Decreased tenderness to palpation noted about the surgical site.   Multiple view plain film radiographs: X-rays were further at this visit will take more x-rays at next appointment Assessment:   1. Postoperative examination   2. Post-operative state   3. Bunion of left foot     Plan:   Patient was evaluated and treated and all questions answered.  S/p foot surgery left foot bunionectomy and tailor's bunionectomy -Progressing very well post-operatively.  Patient denies any pain and there is no evidence of postoperative complication she is continuing to proceed 2 weeks postop -XR: Deferred at this visit we will take new x-rays at next appointment -WB Status: Begin incision from nonweightbearing to gradual return to weightbearing in the cam boot over the course of the next 2 weeks removed -Sutures: Removed in total at this visit -Medications: Tylenol or ibuprofen as needed for pain control -Foot redressed with Ace wrap.  Patient can now begin getting the left foot wet and wash with warm soapy water and then just wear an Ace wrap or other sock as desired  Return in about 2 weeks (around 03/18/2023) for Third postop visit left foot bunion and tailor's bunionectomy.         Everitt Amber, DPM [facility] [place] / [facility]

## 2023-03-25 ENCOUNTER — Ambulatory Visit (INDEPENDENT_AMBULATORY_CARE_PROVIDER_SITE_OTHER): Payer: Commercial Managed Care - HMO | Admitting: Podiatry

## 2023-03-25 DIAGNOSIS — Z9889 Other specified postprocedural states: Secondary | ICD-10-CM

## 2023-03-25 NOTE — Progress Notes (Signed)
  Subjective:  Patient ID: Michele Mclaughlin, female    DOB: 15-Jun-2001,  MRN: [id number]  Chief Complaint  Patient presents with   Routine Post Op    Pov 3 patient states no pain but that her foot feels tight     DOS: 02/17/2023 Procedure: Left foot bunionectomy with distal first metatarsal osteotomy and tailor's bunionectomy with distal fifth metatarsal osteotomy  22 y.o. female returns for third post-op check.  Patient presents for second postoperative check status post left foot bunionectomy with distal first metatarsal osteotomy and tailor's bunionectomy with distal fifth metatarsal osteotomy done via minimally invasive approach.  She is now approximately 4 weeks postop.  Having no pain at this time.  She has been weightbearing in a cam boot for the past 2 weeks without pain.  She does note some tightness in her left foot however thinks this may be due to being in the boot.  Not taking any pain medication at this time  Review of Systems: Negative except as noted in the HPI. Denies N/V/F/Ch.   Objective:  There were no vitals filed for this visit. There is no height or weight on file to calculate BMI. Constitutional Well developed. Well nourished.  Vascular Foot warm and well perfused. Capillary refill normal to all digits.  Calf is soft and supple, no posterior calf or knee pain, negative Homans' sign  Neurologic Normal speech. Oriented to person, place, and time. Epicritic sensation to light touch grossly present bilaterally.  Dermatologic Skin incisions well-healed along the medial and lateral aspect of the left foot  Orthopedic: No tenderness to palpation noted about the surgical site.   Multiple view plain film radiographs: 03/25/2023 XR 3 views AP lateral oblique of the left foot weightbearing.  Findings: Tension directed the first metatarsal there is no to be 2 surgical screws in the transverse osteotomy of the first metatarsal neck.  There is minimal osseous bridging across the  osteotomy site however there is evidence of that is beginning to occur.  Hardware is well-seated without evidence of backing out or movement.  In the fifth metatarsal there is transverse osteotomy of the fifth metatarsal neck with medial displacement of the fifth metatarsal head and single surgical screw without evidence of hardware complication.  Minimal osseous bridging across the osteotomy site Assessment:   1. Post-operative state     Plan:  Patient was evaluated and treated and all questions answered.  S/p foot surgery left foot bunionectomy and tailor's bunionectomy -Progressing very well post-operatively.  Patient continues to improve and not having the pain with walking in the boot as of -XR: No acute postoperative complication -WB Status: Weightbearing in the cam boot for another week and then begin transition to weightbearing as tolerated in good supportive running shoe or sneaker.  Continue to avoid any high impact activities -No further dressings required recommend compression stocking as needed for swelling  Return in about 4 weeks (around 04/22/2023) for 4th POV L foot bunionectomy and tailors bunionectomy.         Everitt Amber, DPM [facility] [place] [facility]

## 2023-04-22 ENCOUNTER — Ambulatory Visit (INDEPENDENT_AMBULATORY_CARE_PROVIDER_SITE_OTHER): Payer: Commercial Managed Care - HMO

## 2023-04-22 ENCOUNTER — Ambulatory Visit (INDEPENDENT_AMBULATORY_CARE_PROVIDER_SITE_OTHER): Payer: Commercial Managed Care - HMO | Admitting: Podiatry

## 2023-04-22 DIAGNOSIS — M21612 Bunion of left foot: Secondary | ICD-10-CM | POA: Diagnosis not present

## 2023-04-22 DIAGNOSIS — Z9889 Other specified postprocedural states: Secondary | ICD-10-CM

## 2023-04-22 DIAGNOSIS — Z09 Encounter for follow-up examination after completed treatment for conditions other than malignant neoplasm: Secondary | ICD-10-CM

## 2023-04-22 NOTE — Progress Notes (Signed)
  Subjective:  Patient ID: Michele Mclaughlin, female    DOB: Feb 01, 2001,  MRN: [id number]  Chief Complaint  Patient presents with   Routine Post Op    Post op     DOS: 02/17/2023 Procedure: Left foot bunionectomy with distal first metatarsal osteotomy and tailor's bunionectomy with distal fifth metatarsal osteotomy  22 y.o. female returns for 4th post-op check.  S/p L foot bunionectomy with distal first metatarsal osteotomy and tailor's bunionectomy with distal fifth metatarsal osteotomy done via minimally invasive approach.  She is now approximately 8 weeks postop.  Having no pain at this time, she is weightbearing in flip flops today.   Review of Systems: Negative except as noted in the HPI. Denies N/V/F/Ch.   Objective:  There were no vitals filed for this visit. There is no height or weight on file to calculate BMI. Constitutional Well developed. Well nourished.  Vascular Foot warm and well perfused. Capillary refill normal to all digits.  Calf is soft and supple, no posterior calf or knee pain, negative Homans' sign  Neurologic Normal speech. Oriented to person, place, and time. Epicritic sensation to light touch grossly present bilaterally.  Dermatologic Skin incisions well-healed along the medial and lateral aspect of the left foot  Orthopedic: No tenderness to palpation noted about the surgical site.   Multiple view plain film radiographs: 04/22/23 XR 3 views AP lateral oblique of the left foot weightbearing.  Findings: attn directed the first metatarsal there is no to be 2 surgical screws in the transverse osteotomy of the first metatarsal neck.  There is increasing osseous bridging across the osteotomy site from prior. Hardware is well-seated without evidence of backing out or movement.  In the fifth metatarsal there is transverse osteotomy of the fifth metatarsal neck with medial displacement of the fifth metatarsal head and single surgical screw without evidence of hardware  complication.  Increased osseous bridging across the osteotomy site Assessment:   1. Post-operative state   2. Postoperative examination   3. Bunion of left foot      Plan:  Patient was evaluated and treated and all questions answered.  S/p foot surgery left foot bunionectomy and tailor's bunionectomy -Progressing very well post-operatively.  Patient continues to improve and not having the pain with walking in reg shoes -XR: No acute postoperative complication -WB Status: Weightbearing as tolerated in regular good supportive shoes, dont recommend flats/flip flops -Patient is doing very well at this time continue to avoid high-impact activities for another 2 to 4 weeks and slowly progress back to that.  I will see her back in 6 weeks for final recheck of this surgical procedure.  Return in about 6 weeks (around 06/03/2023) for 5th POV.         Corinna Gab, DPM [facility] / [facility]

## 2023-04-23 IMAGING — MR MR HEAD WO/W CM
14 series · 45 of 48 positions shown · IV contrast (10ml Multihance)
Comparison: Head CT without contrast 07/04/2018.

CLINICAL DATA: 19-year-old female with recurrent episodes of loss
of consciousness concerning for seizures.

Most recently in [REDACTED]. No known head injury. Abnormal EEG. Primary
generalized epilepsy.
EXAM:
MRI HEAD WITHOUT AND WITH CONTRAST
TECHNIQUE: Multiplanar, multiecho pulse sequences of the brain and surrounding
structures were obtained without and with intravenous contrast.
CONTRAST:  10mL MULTIHANCE GADOBENATE DIMEGLUMINE 529 MG/ML IV SOLN

[Series 2: T1 · sagittal · 5.0mm · 0.45mm/px · 2 of 21 slices shown]
[im 1/21]
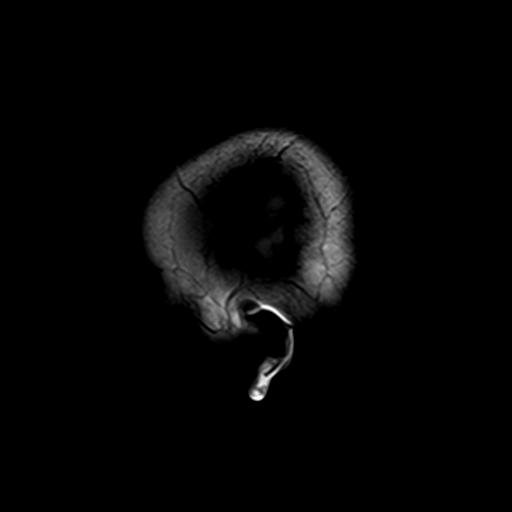
[im 21/21]
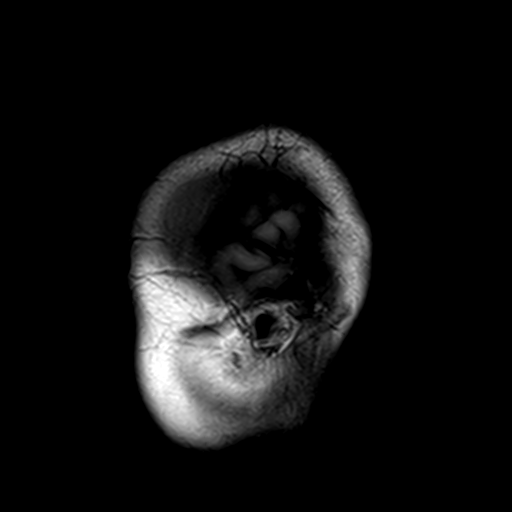

[Series 3: DWI · axial · 3.0mm · 1.80mm/px · z∈[-59,+88]mm · 6 of 100 slices shown (1 of 4)]
[im 1/100]
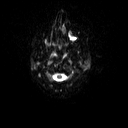
[im 20/100]
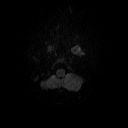
[im 40/100]
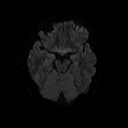
[im 60/100]
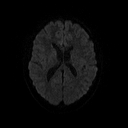
[im 80/100]
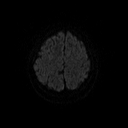
[im 100/100]
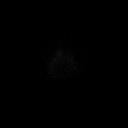

[Series 4: DWI · axial · 3.0mm · 1.80mm/px · z∈[-59,+88]mm · 3 of 50 slices shown (2 of 4)]
[im 1/50]
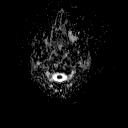
[im 25/50]
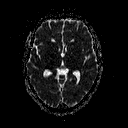
[im 50/50]
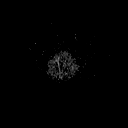

[Series 5: DWI · coronal · 5.0mm · 1.80mm/px · 4 of 68 slices shown (3 of 4)]
[im 1/68]
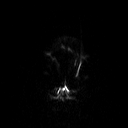
[im 23/68]
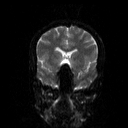
[im 45/68]
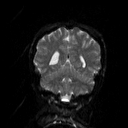
[im 68/68]
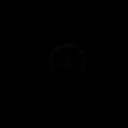

[Series 6: DWI · coronal · 5.0mm · 1.80mm/px · 2 of 34 slices shown (4 of 4)]
[im 1/34]
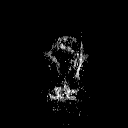
[im 34/34]
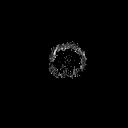

[Series 7: FLAIR · coronal · 3.0mm · 0.47mm/px · 2 of 30 slices shown (1 of 2)]
[im 1/30]
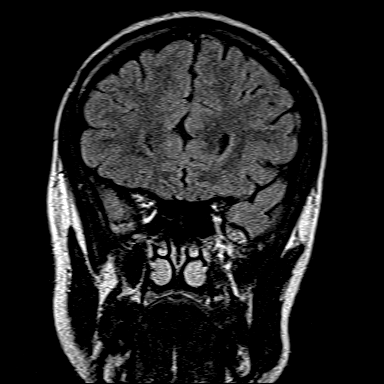
[im 30/30]
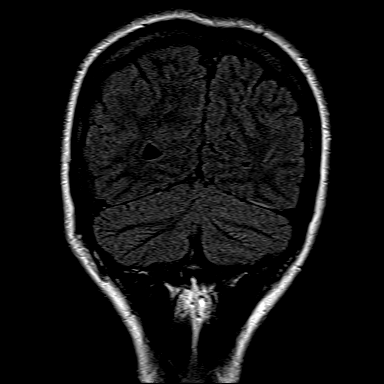

[Series 8: T2 · coronal · 3.0mm · 0.28mm/px · 2 of 30 slices shown (1 of 3)]
[im 1/30]
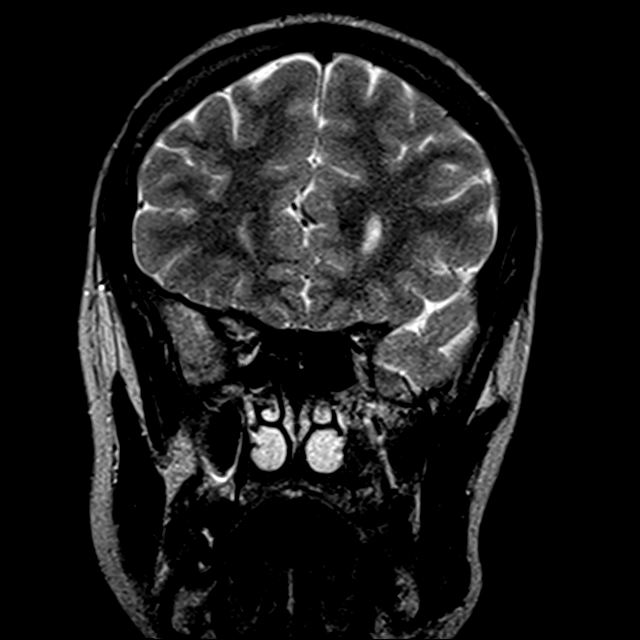
[im 30/30]
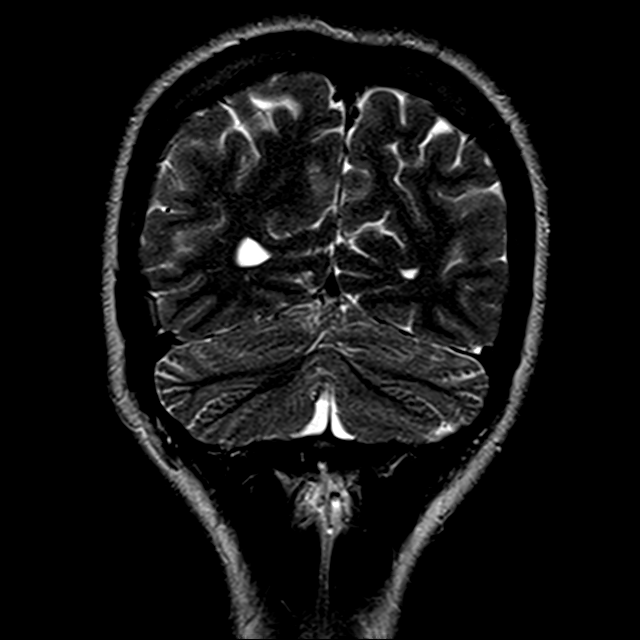

[Series 9: T2 · axial · 5.0mm · 0.90mm/px · 1 of 22 slices shown (2 of 3)]
[im 1/22]
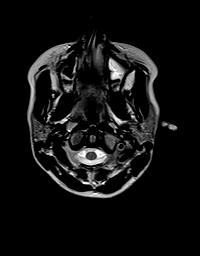

[Series 10: FLAIR · axial · 3.0mm · 0.45mm/px · z∈[-53,+82]mm · 2 of 30 slices shown (2 of 2)]
[im 1/30]
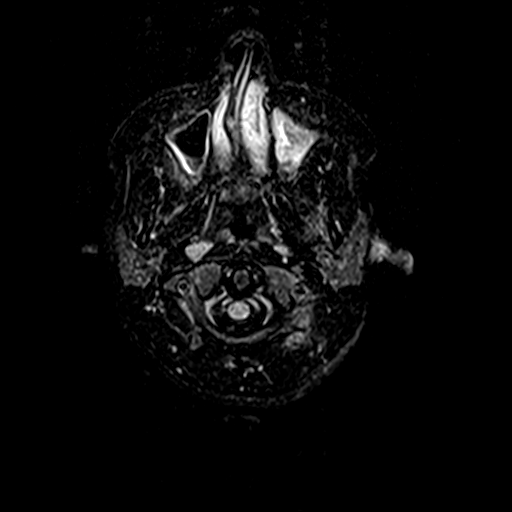
[im 30/30]
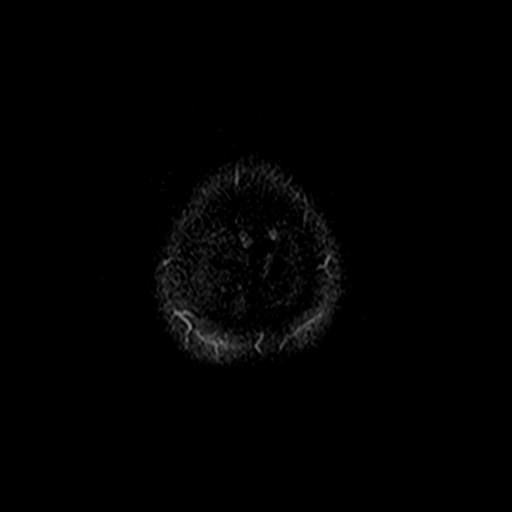

[Series 12: swi_images · axial · 4.0mm · 0.90mm/px · z∈[-56,+84]mm · 2 of 36 slices shown]
[im 1/36]
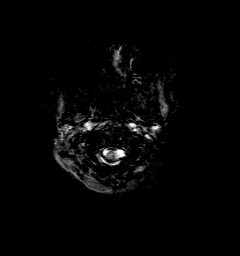
[im 36/36]
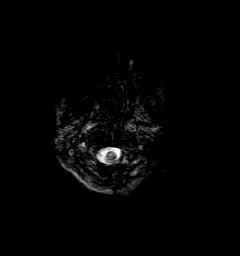

[Series 13: t1_mpr_tra · axial · 1.0mm · 0.75mm/px · z∈[-58,+85]mm · 8 of 144 slices shown]
[im 1/144]
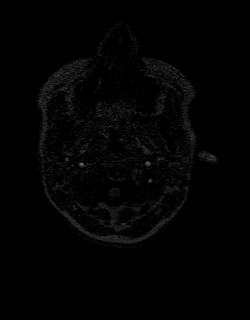
[im 18/144]
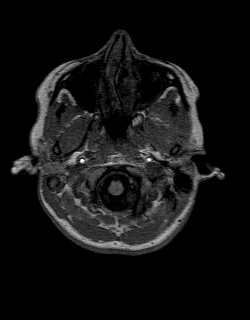
[im 36/144]
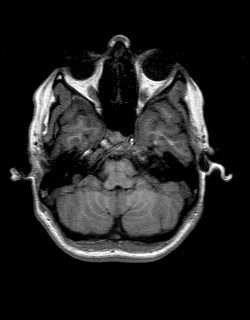
[im 54/144]
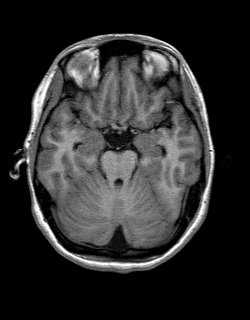
[im 90/144]
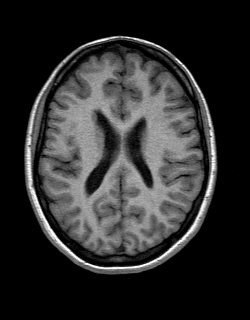
[im 108/144]
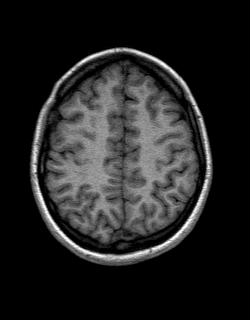
[im 126/144]
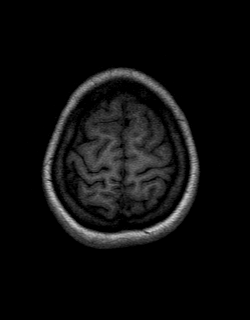
[im 144/144]
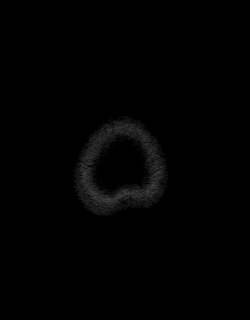

[Series 14: T2 · coronal · 5.0mm · 0.45mm/px · 2 of 25 slices shown (3 of 3)]
[im 1/25]
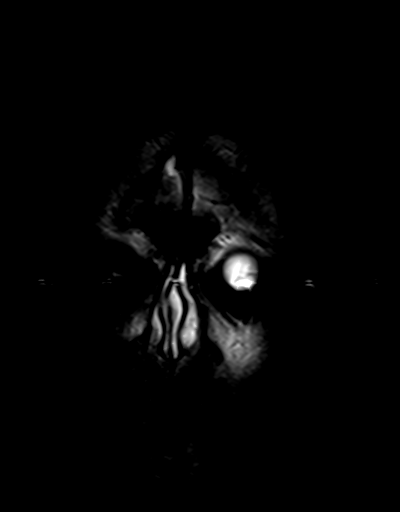
[im 25/25]
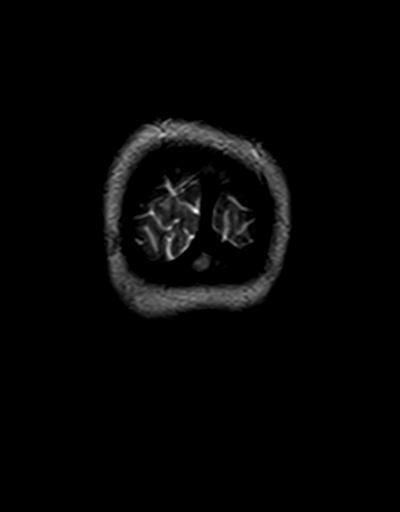

[Series 15: t1_mpr_tra post · axial · 1.0mm · 0.75mm/px · z∈[-58,+85]mm · 8 of 144 slices shown]
[im 1/144]
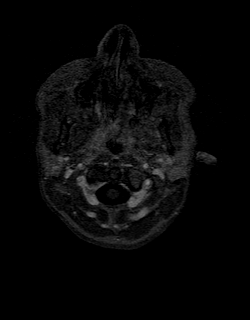
[im 18/144]
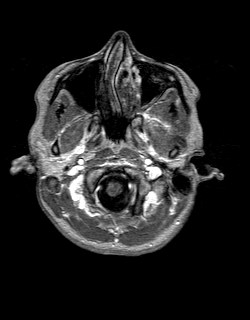
[im 36/144]
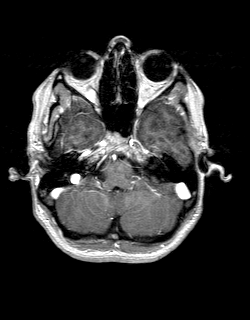
[im 54/144]
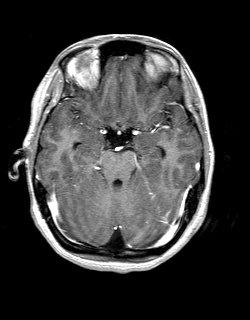
[im 90/144]
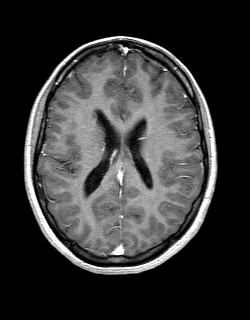
[im 108/144]
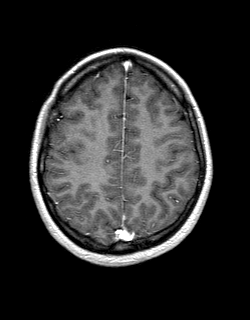
[im 126/144]
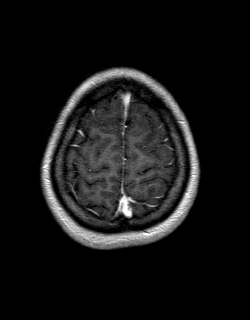
[im 144/144]
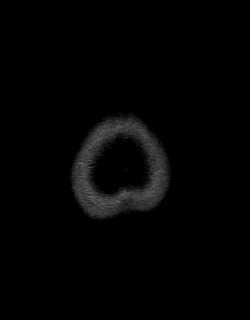

[Series 16: post cor · coronal · 5.0mm · 0.45mm/px · 1 of 25 slices shown]
[im 1/25]
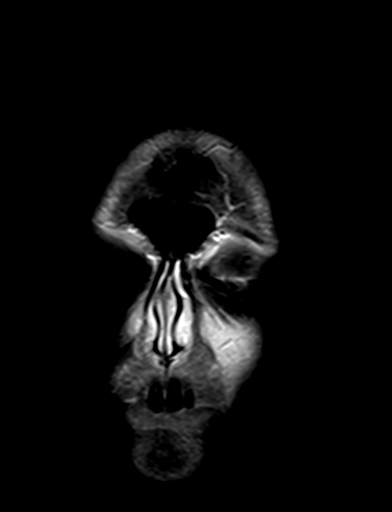

[45 of 48 positions shown; findings below may reference images not displayed]

FINDINGS: Brain: Stable cerebral volume since 1065. Mild mega cisterna magna,
normal variant. Stable mild asymmetry of the lateral ventricles,
presumed variant. No restricted diffusion to suggest acute
infarction. No midline shift, mass effect, evidence of mass lesion,
ventriculomegaly, extra-axial collection or acute intracranial
hemorrhage. Cervicomedullary junction and pituitary are within
normal limits.

No heterotopia or migrational anomaly identified. Background gray
and white matter signal is normal throughout the brain. No cortical
encephalomalacia or chronic cerebral blood products identified.

Thin slice coronal temporal lobe imaging. Hippocampal volume and
signal appears symmetric. Bilateral hippocampal detail is
indistinct, but this is felt related to technical imaging factors.
Other mesial temporal lobe structures appear symmetric and within
limits.

No abnormal enhancement identified. No dural thickening.

Vascular: Major intracranial vascular flow voids are preserved. The
major dural venous sinuses are enhancing and appear to be patent.

Skull and upper cervical spine: Negative. Normal visible bone marrow
signal.

Sinuses/Orbits: Negative orbits. Left greater than right maxillary
sinus mucosal thickening. Small left maxillary fluid level. The
remaining paranasal sinuses are clear.

Other: Trace right inferior mastoid air cell fluid, likely
postinflammatory inconsequential. Mastoids are clear. Negative
nasopharynx. Visible internal auditory structures appear normal.
Negative visible scalp and face soft tissues.
IMPRESSION: 1. No abnormality identified on brain MRI.
2. Left > right maxillary sinus inflammation.

## 2023-06-03 ENCOUNTER — Encounter: Payer: Commercial Managed Care - HMO | Admitting: Podiatry

## 2023-06-03 ENCOUNTER — Ambulatory Visit (INDEPENDENT_AMBULATORY_CARE_PROVIDER_SITE_OTHER): Payer: Commercial Managed Care - HMO

## 2023-06-03 ENCOUNTER — Ambulatory Visit (INDEPENDENT_AMBULATORY_CARE_PROVIDER_SITE_OTHER): Payer: Commercial Managed Care - HMO | Admitting: Podiatry

## 2023-06-03 DIAGNOSIS — M21612 Bunion of left foot: Secondary | ICD-10-CM

## 2023-06-03 DIAGNOSIS — Z9889 Other specified postprocedural states: Secondary | ICD-10-CM

## 2023-06-03 NOTE — Progress Notes (Signed)
Subjective:  Patient ID: Michele Mclaughlin, female    DOB: [birth year],  MRN: [id number]  Chief Complaint  Patient presents with   Routine Post Op    Rm 22 POV #5 DOS 02/17/2023 LT FOOT BUNIONECTOMY W/DISTAL 1ST METATARSAL & Ralene Bathe, TAILOR BUNIONECOTOMY LT FOOT    DOS: 02/17/2023 Procedure: Left foot bunionectomy with distal first metatarsal osteotomy and tailor's bunionectomy with distal fifth metatarsal osteotomy  22 y.o. female returns for 5th post-op check.  S/p L foot bunionectomy with distal first metatarsal osteotomy and tailor's bunionectomy with distal fifth metatarsal osteotomy done via minimally invasive approach.  She is now approximately16 weeks postop.  Having no pain at this time, she is weightbearing and is now wearing good supportive shoes as previously recommended. she says she has no pain aside from occasionally when she is doing a lot of running around and has pain near the osteotomy site.  Very satisfied with the surgery has no concerns whatsoever with regards to the outcome and is interested in proceeding with right foot bunionectomy after the summer is over  Review of Systems: Negative except as noted in the HPI. Denies N/V/F/Ch.   Objective:  There were no vitals filed for this visit. There is no height or weight on file to calculate BMI. Constitutional Well developed. Well nourished.  Vascular Foot warm and well perfused. Capillary refill normal to all digits.  Calf is soft and supple, no posterior calf or knee pain, negative Homans' sign  Neurologic Normal speech. Oriented to person, place, and time. Epicritic sensation to light touch grossly present bilaterally.  Dermatologic Skin incisions well-healed along the medial and lateral aspect of the left foot  Orthopedic: No tenderness to palpation noted about the surgical site.   Multiple view plain film radiographs: 06/03/2023 XR 3 views AP lateral oblique of the left foot weightbearing.  Findings: attn  directed the first metatarsal there is no to be 2 surgical screws in the transverse osteotomy of the first metatarsal neck.  There is near complete osseous bridging across the osteotomy site from prior-small area on the lateral view at the dorsal aspect of the first metatarsal neck that is not yet completely healed however it is 90 to 95% complete. Hardware is well-seated without evidence of backing out or movement.  In the fifth metatarsal there is transverse osteotomy of the fifth metatarsal neck with medial displacement of the fifth metatarsal head and single surgical screw without evidence of hardware complication.  Complete osseous healing noted in the fifth metatarsal osteotomy Assessment:   1. Bunion of left foot   2. Post-operative state    Plan:  Patient was evaluated and treated and all questions answered.  S/p foot surgery left foot bunionectomy and tailor's bunionectomy -Patient is now completely healed postoperatively.  No concerns with regards to healing she is very satisfied with surgical outcome. -XR: No acute postoperative complication, excellent osseous bridging across the osteotomy sites no concern -WB Status: Weightbearing as tolerated in regular good supportive shoes -Patient is now discharged from this surgery postoperatively with no restrictions.  Continue to monitor for any pain especially with activity.  # Right foot bunion -Discussed that we will consider and discuss right foot minimally invasive bunionectomy in 3 months after the summer is over and plan to do that potentially later this year if she is wishing to proceed  Return in about 3 months (around 09/03/2023) for R foot bunion.         Corinna Gab, DPM [facility] / [facility]

## 2023-06-08 ENCOUNTER — Ambulatory Visit: Payer: Commercial Managed Care - HMO | Admitting: Neurology

## 2023-06-14 ENCOUNTER — Ambulatory Visit: Payer: Commercial Managed Care - HMO | Admitting: Neurology

## 2023-07-09 ENCOUNTER — Ambulatory Visit (INDEPENDENT_AMBULATORY_CARE_PROVIDER_SITE_OTHER): Payer: Commercial Managed Care - HMO | Admitting: Neurology

## 2023-07-09 ENCOUNTER — Encounter: Payer: Self-pay | Admitting: Neurology

## 2023-07-09 VITALS — BP 115/65 | HR 88 | Ht 61.0 in | Wt 108.0 lb

## 2023-07-09 DIAGNOSIS — G40309 Generalized idiopathic epilepsy and epileptic syndromes, not intractable, without status epilepticus: Secondary | ICD-10-CM

## 2023-07-09 MED ORDER — LEVETIRACETAM 500 MG PO TABS: 500.0000 mg | ORAL_TABLET | Freq: Two times a day (BID) | ORAL | 3 refills | Status: DC

## 2023-07-09 NOTE — Progress Notes (Signed)
NEUROLOGY FOLLOW UP OFFICE NOTE  Michele Mclaughlin [id number] 11/21/2001  HISTORY OF PRESENT ILLNESS: I had the pleasure of seeing Michele Mclaughlin in follow-up in the neurology clinic on 07/09/2023.  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 She is alone in the office today. Records and images were personally reviewed where available.  She continues to do well seizure-free since 12/25/2021 on Levetiracetam 500mg  BID, no side effects. She denies any staring/unresponsive episodes, gaps in time, olfactory/gustatory hallucinations, focal numbness/tingling/weakness, myoclonic jerks. No headaches, dizziness, vision changes, no falls. She gets 7 hours of sleep. Mood is good. She is finishing EMT school soon. No pregnancy plans.    History on Initial Assessment 07/31/2021: This is a pleasant 22 year old right-handed woman with a history of seizures and syncope presenting for evaluation. She was previously evaluated by pediatric neurologist Dr. Devonne Doughty for seizure-like activity. The first episode occurred in April 2019 when her mother heard a nosie from her room and found her confused and disoriented for 2 minutes, no tonic-clonic activity noted. The second episode occurred June 2019 when again her parents heard a noise and found her on the floor with tongue bite and confusion for about 2 minutes, no motor activity noted. She had another event in July 2019 where she was witnessed to have behavioral arrest then became tight and stiff, not responding so they lay her on the floor where she was noted to have stiffening more on the left arm with flexion of both upper extremities and jaw stiffening for 4-5 minutes followed by confusion. She had an EEG in July 2019 with note of 1 very brief cluster of generalized discharges in wakefulness. Repeat EEG that month showed 2 brief clusters of generalized discharges in the form of spike and slow wave activity lasting less than 1 second. She was  prescribed Keppra but did not take it. She was event-free until January 2022 when she recalls taking a shower early in the morning then waking up sitting on the carpet with blood, confused. She found a gash on her head that required staples. The most recent episode occurred on 06/19/21, she was in her bedroom then woke up in her parents' bedroom, apparently her brother found her passed out on the floor, no tongue bite or incontinence. She was a little stiff, no focal weakness, headaches, dizziness. The day prior was very stressful for her, her grandmother had passed away and her sister had given birth. She lives with her parents and 2 brothers who have not mentioned any staring/unresponsive episodes. She denies any gaps in time, olfactory/gustatory hallucinations, deja vu, rising epigastric sensation, focal numbness/tingling/weakness, myoclonic jerks.No headaches, dizziness, diplopia, dysarthria/dysphagia, neck/back pain, bowel/bladder dysfunction. Memory is pretty good. She had been evaluated by Cardiologist with plans for workup, however she is seeking a second opinion. 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   Epilepsy Risk Factors:  She had a normal birth and early development.  There is no history of febrile convulsions, CNS infections such as meningitis/encephalitis, significant traumatic brain injury, neurosurgical procedures, or family history of seizures.  Diagnostic Data: MRI brain with and without contrast done 07/2021 normal, hippocampi symmetric with no abnormal signal or enhancement.  EEG in 07/2021 showed occasional bursts of generalized irregular high voltage 3-4 Hz spike and wave discharges with frontal predominance.   PAST MEDICAL HISTORY: Past Medical History:  Diagnosis Date   Generalized seizures [facility])     MEDICATIONS: Current Outpatient Medications  on File Prior to Visit  Medication Sig Dispense Refill   levETIRAcetam (KEPPRA) 500 MG tablet Take 1 tablet (500 mg  total) by mouth 2 (two) times daily. 180 tablet 3   Multiple Vitamins-Minerals (ONE-A-DAY WOMENS PO) Take by mouth.     norethindrone-ethinyl estradiol (LOESTRIN) 1-20 MG-MCG tablet Take 1 tablet by mouth daily.     No current facility-administered medications on file prior to visit.    ALLERGIES: No Known Allergies  FAMILY HISTORY: Family History  Problem Relation Age of Onset   Hypertension Father    Arrhythmia Father        Afib   Aortic aneurysm Father    Migraines Neg Hx    Seizures Neg Hx    Autism Neg Hx    ADD / ADHD Neg Hx    Anxiety disorder Neg Hx    Depression Neg Hx    Bipolar disorder Neg Hx    Schizophrenia Neg Hx     SOCIAL HISTORY: Social History   Socioeconomic History   Marital status: Single    Spouse name: Not on file   Number of children: Not on file   Years of education: Not on file   Highest education level: Not on file  Occupational History   Not on file  Tobacco Use   Smoking status: Never   Smokeless tobacco: Never  Vaping Use   Vaping status: Never Used  Substance and Sexual Activity   Alcohol use: Never   Drug use: Never   Sexual activity: Not on file  Other Topics Concern   Not on file  Social History Narrative   Lives with mom, dad, two brothers. She is going into the 11th grade and is home schooled. She enjoys baby sitting, running, and swimming   Right handed    Caffeine   Has 5 brothers and one sister   Social Determinants of Health   Financial Resource Strain: Not on file  Food Insecurity: No Food Insecurity (06/03/2021)   Received from [facility]   Hunger Vital Sign    Worried About Running Out of Food in the Last Year: Never true    Ran Out of Food in the Last Year: Never true  Transportation Needs: Not on file  Physical Activity: Not on file  Stress: Not on file  Social Connections: Unknown (05/05/2022)   Received from [facility], [facility]   Social Network    Social Network: Not on file   Intimate Partner Violence: Unknown (03/27/2022)   Received from [facility], [facility]   HITS    Physically Hurt: Not on file    Insult or Talk Down To: Not on file    Threaten Physical Harm: Not on file    Scream or Curse: Not on file     PHYSICAL EXAM: Vitals:   07/09/23 1257  BP: 115/65  Pulse: 88  SpO2: 98%   General: No acute distress Head:  Normocephalic/atraumatic Skin/Extremities: No rash, no edema Neurological Exam: alert and awake. No aphasia or dysarthria. Fund of knowledge is appropriate.  Attention and concentration are normal.   Cranial nerves: Pupils equal, round. Extraocular movements intact with no nystagmus. Visual fields full.  No facial asymmetry.  Motor: Bulk and tone normal, muscle strength 5/5 throughout with no pronator drift.   Finger to nose testing intact.  Gait narrow-based and steady, able to tandem walk adequately.  Romberg negative.   IMPRESSION: This is a very pleasant 22 yo RH woman with  primary generalized epilepsy. She continues to do well seizure-free since 12/25/2021 on Levetiracetam 500mg  BID, refills sent. She is aware of [place] driving laws to stop driving after a seizure until 6 months seizure-free. Follow-up in 1 year, call for any changes.   Thank you for allowing me to participate in her care.  Please do not hesitate to call for any questions or concerns.    Patrcia Dolly, M.D.   CC: Dr. Hal Hope

## 2023-07-09 NOTE — Patient Instructions (Signed)
Always a pleasure to see you. Continue Levetiractam (Keppra) 500mg  twice a day. Follow-up in 1 year, call for any changes.   Seizure Precautions: 1. If medication has been prescribed for you to prevent seizures, take it exactly as directed.  Do not stop taking the medicine without talking to your doctor first, even if you have not had a seizure in a long time.   2. Avoid activities in which a seizure would cause danger to yourself or to others.  Don't operate dangerous machinery, swim alone, or climb in high or dangerous places, such as on ladders, roofs, or girders.  Do not drive unless your doctor says you may.  3. If you have any warning that you may have a seizure, lay down in a safe place where you can't hurt yourself.    4.  No driving for 6 months from last seizure, as per [facility].   Please refer to the following link on the Epilepsy Foundation of America's website for more information: http://www.epilepsyfoundation.org/answerplace/Social/driving/drivingu.cfm   5.  Maintain good sleep hygiene. Avoid alcohol.  6.  Notify your neurology if you are planning pregnancy or if you become pregnant.  7.  Contact your doctor if you have any problems that may be related to the medicine you are taking.  8.  Call 911 and bring the patient back to the ED if:        A.  The seizure lasts longer than 5 minutes.       B.  The patient doesn't awaken shortly after the seizure  C.  The patient has new problems such as difficulty seeing, speaking or moving  D.  The patient was injured during the seizure  E.  The patient has a temperature over 102 F (39C)  F.  The patient vomited and now is having trouble breathing

## 2023-09-09 ENCOUNTER — Ambulatory Visit (INDEPENDENT_AMBULATORY_CARE_PROVIDER_SITE_OTHER): Payer: Commercial Managed Care - HMO | Admitting: Podiatry

## 2023-09-09 DIAGNOSIS — M21621 Bunionette of right foot: Secondary | ICD-10-CM

## 2023-09-09 DIAGNOSIS — M21611 Bunion of right foot: Secondary | ICD-10-CM | POA: Diagnosis not present

## 2023-09-09 NOTE — Progress Notes (Signed)
Subjective:  Patient ID: Michele Mclaughlin, female    DOB: Sep 23, 2001,  MRN: [id number]  Chief Complaint  Patient presents with   Follow-up    3 month follow up post op left bunion. Patient is doing well. She does have some soreness at times when she is on her feet a lot.   Right foot bunion. She is not having pain to the right bunion but does have some soreness to the lateral aspect of right foot.     22 y.o. female presents with the above complaint.  Patient presents today for follow-up on left foot bunion.  She is approximately 3 months postop for that foot.  Had bunionectomy with distal first metatarsal osteotomy with minimally invasive approach as well as minimally invasive tailor's bunionectomy with single screw placement via MIS approach. she is doing very well and she denies any pain in her left foot at this time very happy with the surgical result.  She has gone back to doing some light running and says that there is some discomfort in the great toe joint with running however it is minimal and has gotten better progressively.  Also has pain on the right foot fifth metatarsal head area also has bump on the inside of the foot as well.  Has tried shoe gear modification different wider toebox shoes gel padding cushioning anti-inflammatories and nothing has helped  Review of Systems: Negative except as noted in the HPI. Denies N/V/F/Ch.  Past Medical History:  Diagnosis Date   Generalized seizures (HCC)     Current Outpatient Medications:    levETIRAcetam (KEPPRA) 500 MG tablet, Take 1 tablet (500 mg total) by mouth 2 (two) times daily., Disp: 180 tablet, Rfl: 3   Multiple Vitamins-Minerals (ONE-A-DAY WOMENS PO), Take by mouth., Disp: , Rfl:    norethindrone-ethinyl estradiol (LOESTRIN) 1-20 MG-MCG tablet, Take 1 tablet by mouth daily., Disp: , Rfl:   Social History   Tobacco Use  Smoking Status Never  Smokeless Tobacco Never    No Known Allergies Objective:  There were no vitals  filed for this visit. There is no height or weight on file to calculate BMI. Constitutional Well developed. Well nourished.  Vascular Dorsalis pedis pulses palpable bilaterally. Posterior tibial pulses palpable bilaterally. Capillary refill normal to all digits.  No cyanosis or clubbing noted. Pedal hair growth normal.  Neurologic Normal speech. Oriented to person, place, and time. Epicritic sensation to light touch grossly present bilaterally.  Dermatologic Nails well groomed and normal in appearance. No open wounds. No skin lesions.  Orthopedic: Normal joint ROM without pain or crepitus bilaterally. Hallux abductovalgus deformity mild deformity present on the right foot with prominence of the medial eminence.  Hallux valgus deformity is present no hypermobility of the first ray Right foot with prominence of the fifth metatarsal head as well with tailor's bunion deformity noted.  Mild inflammation and edema of the fifth metatarsal phalangeal joint.  Left foot with no prominence of the first metatarsal head improved from preoperatively no pain on palpation of the area joint is without  pain with range of motion which is near normal.  On the left foot fifth metatarsal head there is been tailor's bunionectomy and no prominence or pain on the lateral aspect of the fifth metatarsal head improved from preop Left 1st MPJ full range of motion.  Left 1st TMT without gross hypermobility. Right 1st MPJ full range of motion  Right 1st TMT without gross hypermobility. Lesser digital contractures absent bilaterally.   Radiographs:  12/24/2022 XR 3 views AP lateral oblique of the right foot weightbearing Increased first intermetatarsal angle mild to moderate with prominence of the medial aspect of the first metatarsal head mild hallux valgus deformity hallux valgus angle is increased.  Abnormal tibial sesamoid position.  Tailor's bunion deformity present with increased fourth intermetatarsal angle and  prominence laterally of the fifth metatarsal head   Assessment:   1. Bunion of right foot   2. Tailor's bunion of right foot    Plan:  Patient was evaluated and treated and all questions answered.  #Hallux abductovalgus deformity, Right foot #Tailors bunionectomy Right foot -XR as above. -Overall discussed conservative and surgical treatment options for right foot bunion deformity as well as tailor's bunion deformity -Patient states she does have some pain with the right foot bunion as well as a tailor's bunion and is limiting her shoe gear options -As patient did very well with her left foot I would recommend the same surgery to include distal first metatarsal osteotomy via minimally invasive approach possible Akin osteotomy though unlikely as well as tailor's bunionectomy with distal fifth metatarsal osteotomy via minimally invasive approach -Patient has failed all conservative therapy and wishes to proceed with surgical intervention. All risks, benefits, and alternatives discussed with patient. No guarantees given. Consent reviewed and signed by patient. Post-op course explained at length. -Planned procedures: Right foot bunionectomy with distal first metatarsal osteotomy and distal fifth metatarsal tailor's bunionectomy both via minimally invasive approach -Informed surgical consent was obtained at this visit we will begin surgical planning  # Status post left foot bunionectomy and tailor's bunionectomy -Overall doing very well no concern for complications patient is pain-free and has good range of motion of the first IPJ near normal -Continue with no restrictions to activity on the left foot

## 2023-09-20 ENCOUNTER — Telehealth: Payer: Self-pay | Admitting: Podiatry

## 2023-09-20 NOTE — Telephone Encounter (Signed)
DOS- 10-30/2024  Serafina Royals [id number] POSS Ralene Bathe [id number] TAILORS BUNIONECTOMY [phone]   [employer] EFFECTIVE DATE- 08/21/2022  OOP- $1800.00 WITH REMAINING $0.00 COINSURANCE- 25%  PER [employer]'S AUTOMATED SYSTEM NO PRIOR AUTH IS REQUIRED FOR CPT CODES 95621,30865 AND 78469.  AUTH CALL CONFIRMATION NUMBERS ( 1 PER CPT CODE) - [id number] , [id number] , [id number]   [facility] EFFECTIVE DATE- 09/21/2023  PER INCOMING FAX FROM [facility] MEDICAID, PRIOR AUTHORIZATION IS NOT REQUIRED FOR CPT CODES 62952, [id number] AND [id number].

## 2023-10-19 ENCOUNTER — Other Ambulatory Visit: Payer: Self-pay | Admitting: Podiatry

## 2023-10-19 MED ORDER — OXYCODONE-ACETAMINOPHEN 5-325 MG PO TABS: 1.0000 | ORAL_TABLET | ORAL | 0 refills | Status: AC | PRN

## 2023-10-19 MED ORDER — CEPHALEXIN 500 MG PO CAPS: 500.0000 mg | ORAL_CAPSULE | Freq: Three times a day (TID) | ORAL | 0 refills | Status: AC

## 2023-10-19 MED ORDER — IBUPROFEN 800 MG PO TABS: 800.0000 mg | ORAL_TABLET | Freq: Three times a day (TID) | ORAL | 1 refills | Status: DC | PRN

## 2023-10-19 NOTE — Progress Notes (Signed)
Post-op meds sent

## 2023-10-20 DIAGNOSIS — M2011 Hallux valgus (acquired), right foot: Secondary | ICD-10-CM | POA: Diagnosis not present

## 2023-10-28 ENCOUNTER — Ambulatory Visit (INDEPENDENT_AMBULATORY_CARE_PROVIDER_SITE_OTHER): Payer: Medicaid Other | Admitting: Podiatry

## 2023-10-28 ENCOUNTER — Encounter: Payer: Self-pay | Admitting: Podiatry

## 2023-10-28 ENCOUNTER — Ambulatory Visit (INDEPENDENT_AMBULATORY_CARE_PROVIDER_SITE_OTHER): Payer: Managed Care, Other (non HMO)

## 2023-10-28 VITALS — Ht 61.0 in | Wt 108.0 lb

## 2023-10-28 DIAGNOSIS — Z09 Encounter for follow-up examination after completed treatment for conditions other than malignant neoplasm: Secondary | ICD-10-CM

## 2023-10-28 DIAGNOSIS — M21611 Bunion of right foot: Secondary | ICD-10-CM

## 2023-10-28 NOTE — Progress Notes (Signed)
  Subjective:  Patient ID: Michele Mclaughlin, female    DOB: 03/10/2001,  MRN: [id number]  Chief Complaint  Patient presents with   Routine Post Op    POV #1, DOS 10/20/2023---RIGHT FOOT BUNIONECTOMY AND TAILORS BUNIONECTOMY    DOS: 10/20/2023 Procedure: Right foot minimally invasive bunionectomy with distal first metatarsal osteotomy and tailor's bunionectomy with distal fifth metatarsal osteotomy  22 y.o. female returns for post-op check.  Patient returns for first postop check approximately 1 week status post above procedures.  Nonweightbearing in cam boot with aid of crutches.  Dressing remained clean dry and intact since OR.  Doing very well denies pain  Review of Systems: Negative except as noted in the HPI. Denies N/V/F/Ch.   Objective:  There were no vitals filed for this visit. Body mass index is 20.41 kg/m. Constitutional Well developed. Well nourished.  Vascular Foot warm and well perfused. Capillary refill normal to all digits.  Calf is soft and supple, no posterior calf or knee pain, negative Homans' sign  Neurologic Normal speech. Oriented to person, place, and time. Epicritic sensation to light touch grossly present bilaterally.  Dermatologic Skin healing well without signs of infection. Skin edges well coapted without signs of infection.  Orthopedic: Tenderness to palpation noted about the surgical site.   Multiple view plain film radiographs: XR 3 views AP lateral oblique right foot nonweightbearing.  Findings: Reduction of the first and metatarsal angle with distal first metatarsal osteotomy and lateral displacement of the first metatarsal head.  Fixated with 2 screws.  Also tailor's bunionectomy with medial displacement of the fifth metatarsal head and transverse osteotomy of the fifth metatarsal neck held with 1 surgical screw.  No displacement postoperatively no hardware complication. Assessment:   1. Bunion of right foot   2. Postoperative examination    Plan:   Patient was evaluated and treated and all questions answered.  S/p foot surgery right foot MIS bunionectomy and MIS tailor's bunionectomy -Progressing as expected post-operatively.  Doing very well minimal pain very satisfied with surgical correction -XR: As above no acute postop complication -WB Status: Nonweightbearing in cam boot for another week and then transition back to weightbearing in cam boot for 3-4 weeks before returning for next postop visit -Sutures: removed in total at this vist -Medications: Percocet ibuprofen as needed for pain -Foot redressed. - Ok to begin getting foot wet in shower, reapply ace wrap for compression.        Corinna Gab, DPM [facility] / [facility]

## 2023-11-25 ENCOUNTER — Ambulatory Visit (INDEPENDENT_AMBULATORY_CARE_PROVIDER_SITE_OTHER): Payer: Medicaid Other | Admitting: Podiatry

## 2023-11-25 DIAGNOSIS — Z09 Encounter for follow-up examination after completed treatment for conditions other than malignant neoplasm: Secondary | ICD-10-CM

## 2023-11-25 DIAGNOSIS — M21611 Bunion of right foot: Secondary | ICD-10-CM

## 2023-11-25 NOTE — Progress Notes (Signed)
  Subjective:  Patient ID: Michele Mclaughlin, female    DOB: [birth year],  MRN: [id number]  Chief Complaint  Patient presents with   Routine Post Op    POV #2, DOS 10/20/2023---RIGHT FOOT BUNIONECTOMY AND TAILORS BUNIONECTOMY Per patient her foot is tender and a little numb.    DOS: 10/20/2023 Procedure: Right foot minimally invasive bunionectomy with distal first metatarsal osteotomy and tailor's bunionectomy with distal fifth metatarsal osteotomy  22 y.o. female returns for post-op check.  Patient returns for first postop check approximately 5 week status post above procedures.  Patient has been doing well has some numbness about the first ray but overall no pain or concerns on her behalf.  She is walking in cam boot without any pain.  No issues on the fifth metatarsal area near the tailor's bunionectomy that was performed.  Review of Systems: Negative except as noted in the HPI. Denies N/V/F/Ch.   Objective:  There were no vitals filed for this visit. There is no height or weight on file to calculate BMI. Constitutional Well developed. Well nourished.  Vascular Foot warm and well perfused. Capillary refill normal to all digits.  Calf is soft and supple, no posterior calf or knee pain, negative Homans' sign  Neurologic Normal speech. Oriented to person, place, and time. Epicritic sensation to light touch grossly present bilaterally, slight decrease about the 1st interspace on the right foot.  Dermatologic Skin incisions fully healed.   Orthopedic: No tenderness to palpation noted about the surgical site.   Multiple view plain film radiographs: Deferred at this visit Assessment:   1. Bunion of right foot   2. Postoperative examination     Plan:  Patient was evaluated and treated and all questions answered.  S/p foot surgery right foot MIS bunionectomy and MIS tailor's bunionectomy -Progressing as expected post-operatively.  Doing very well minimal pain very satisfied with  surgical correction -Discussed that mild sensation to light touch abnormality/numbness about the first ray may be related to transient nerve damage hopefully will improve with time -XR: Deferred this visit -WB Status: Nonweightbearing in cam boot for 1 more week before transitioning back to regular good supportive shoe gear including sneakers -Sutures: Previously removed -Medications: NSAID as needed for pain -No dressing needed at this time wound is fully healed         Corinna Gab, DPM [facility] / [facility]

## 2024-01-06 ENCOUNTER — Ambulatory Visit: Payer: Medicaid Other | Admitting: Podiatry

## 2024-01-13 ENCOUNTER — Ambulatory Visit (INDEPENDENT_AMBULATORY_CARE_PROVIDER_SITE_OTHER): Payer: Medicaid Other

## 2024-01-13 ENCOUNTER — Encounter: Payer: Self-pay | Admitting: Podiatry

## 2024-01-13 ENCOUNTER — Ambulatory Visit (INDEPENDENT_AMBULATORY_CARE_PROVIDER_SITE_OTHER): Payer: Medicaid Other | Admitting: Podiatry

## 2024-01-13 DIAGNOSIS — M21611 Bunion of right foot: Secondary | ICD-10-CM

## 2024-01-13 DIAGNOSIS — Z9889 Other specified postprocedural states: Secondary | ICD-10-CM

## 2024-01-13 NOTE — Progress Notes (Signed)
  Subjective:  Patient ID: Michele Mclaughlin, female    DOB: [birth year],  MRN: [id number]  Chief Complaint  Patient presents with   Bunion of Right Foot    Doing well. Surgery in October 2024. No issues since. Denies swelling, denies pain, denies drainage.     DOS: 10/20/2023 Procedure: Right foot minimally invasive bunionectomy with distal first metatarsal osteotomy and tailor's bunionectomy with distal fifth metatarsal osteotomy  23 y.o. female returns for post-op check.  Patient returns for postop check approximately 10 weeks postoperative.  She is doing well denies any issues back to walking regular shoes.  She reports no issues on the left foot either which had been operated on last year in February with the same procedures.  Review of Systems: Negative except as noted in the HPI. Denies N/V/F/Ch.   Objective:  There were no vitals filed for this visit. There is no height or weight on file to calculate BMI. Constitutional Well developed. Well nourished.  Vascular Foot warm and well perfused. Capillary refill normal to all digits.  Calf is soft and supple, no posterior calf or knee pain, negative Homans' sign  Neurologic Normal speech. Oriented to person, place, and time. Epicritic sensation to light touch grossly present bilaterally, slight decrease about the 1st interspace on the right foot.  Dermatologic Skin incisions fully healed.   Orthopedic: No tenderness to palpation noted about the surgical site medial or lateral foot.   Multiple view plain film radiographs: 01/13/2024 XR 3 views AP lateral oblique of the right foot weightbearing.  Findings: Osseous bridging noted across the osteotomy site no complications noted decreased first intermetatarsal angle.  Hardware is intact without any issues improved hallux alignment with decreased hallux valgus angle.  Fifth metatarsal healing with osseous bridging hardware intact metatarsal head is displaced medially compared to the shaft of the  metatarsal Assessment:   1. Post-operative state   2. Bunion of right foot      Plan:  Patient was evaluated and treated and all questions answered.  S/p foot surgery right foot MIS bunionectomy and MIS tailor's bunionectomy -Progressing as expected post-operatively.  Patient is very happy with surgical outcome denies any issues with shoe gear as she has been wearing -No issues with numbness or tingling at this time -XR: As above expected postop changes -WB Status: Weightbearing as tolerated in regular shoes -Sutures: Previously removed -Medications: NSAID as needed for pain -Follow-up as needed         Corinna Gab, DPM [facility] / [facility]

## 2024-01-26 ENCOUNTER — Emergency Department (HOSPITAL_COMMUNITY)
Admission: EM | Admit: 2024-01-26 | Discharge: 2024-01-27 | Disposition: A | Discharge status: Home / Self Care | Payer: Commercial Managed Care - HMO | Attending: Emergency Medicine | Admitting: Emergency Medicine

## 2024-01-26 ENCOUNTER — Encounter (HOSPITAL_COMMUNITY): Payer: Self-pay

## 2024-01-26 ENCOUNTER — Emergency Department (HOSPITAL_COMMUNITY): Payer: Commercial Managed Care - HMO

## 2024-01-26 ENCOUNTER — Other Ambulatory Visit: Payer: Self-pay

## 2024-01-26 DIAGNOSIS — D72829 Elevated white blood cell count, unspecified: Secondary | ICD-10-CM | POA: Diagnosis not present

## 2024-01-26 DIAGNOSIS — N8300 Follicular cyst of ovary, unspecified side: Secondary | ICD-10-CM

## 2024-01-26 DIAGNOSIS — R109 Unspecified abdominal pain: Secondary | ICD-10-CM | POA: Diagnosis present

## 2024-01-26 DIAGNOSIS — N83202 Unspecified ovarian cyst, left side: Secondary | ICD-10-CM | POA: Diagnosis not present

## 2024-01-26 LAB — HCG, SERUM, QUALITATIVE: Preg, Serum: NEGATIVE

## 2024-01-26 LAB — URINALYSIS, ROUTINE W REFLEX MICROSCOPIC
Bilirubin Urine: NEGATIVE
Glucose, UA: NEGATIVE mg/dL
Ketones, ur: 5 mg/dL — AB
Nitrite: NEGATIVE
Protein, ur: NEGATIVE mg/dL
Specific Gravity, Urine: 1.01 (ref 1.005–1.030)
WBC, UA: >50 WBC/hpf (ref 0–5)
pH: 5 (ref 5.0–8.0)

## 2024-01-26 LAB — CBC WITH DIFFERENTIAL/PLATELET
Abs Immature Granulocytes: 0.02 10*3/uL (ref 0.00–0.07)
Basophils Absolute: 0.1 10*3/uL (ref 0.0–0.1)
Basophils Relative: 1 %
Eosinophils Absolute: 0.1 10*3/uL (ref 0.0–0.5)
Eosinophils Relative: 1 %
HCT: 42.5 % (ref 36.0–46.0)
Hemoglobin: 14 g/dL (ref 12.0–15.0)
Immature Granulocytes: 0 %
Lymphocytes Relative: 29 %
Lymphs Abs: 3.1 10*3/uL (ref 0.7–4.0)
MCH: 28.2 pg (ref 26.0–34.0)
MCHC: 32.9 g/dL (ref 30.0–36.0)
MCV: 85.5 fL (ref 80.0–100.0)
Monocytes Absolute: 0.8 10*3/uL (ref 0.1–1.0)
Monocytes Relative: 7 %
Neutro Abs: 6.6 10*3/uL (ref 1.7–7.7)
Neutrophils Relative %: 62 %
Platelets: 283 10*3/uL (ref 150–400)
RBC: 4.97 MIL/uL (ref 3.87–5.11)
RDW: 11.6 % (ref 11.5–15.5)
WBC: 10.6 10*3/uL — ABNORMAL HIGH (ref 4.0–10.5)
nRBC: 0 % (ref 0.0–0.2)

## 2024-01-26 LAB — COMPREHENSIVE METABOLIC PANEL
ALT: 14 U/L (ref 0–44)
AST: 22 U/L (ref 15–41)
Albumin: 4.6 g/dL (ref 3.5–5.0)
Alkaline Phosphatase: 62 U/L (ref 38–126)
Anion gap: 10 (ref 5–15)
BUN: 11 mg/dL (ref 6–20)
CO2: 24 mmol/L (ref 22–32)
Calcium: 9.3 mg/dL (ref 8.9–10.3)
Chloride: 102 mmol/L (ref 98–111)
Creatinine, Ser: 0.73 mg/dL (ref 0.44–1.00)
GFR, Estimated: >60 mL/min (ref >60)
Glucose, Bld: 78 mg/dL (ref 70–99)
Potassium: 3.7 mmol/L (ref 3.5–5.1)
Sodium: 136 mmol/L (ref 135–145)
Total Bilirubin: 0.9 mg/dL (ref 0.0–1.2)
Total Protein: 8.1 g/dL (ref 6.5–8.1)

## 2024-01-26 LAB — LIPASE, BLOOD: Lipase: 27 U/L (ref 11–51)

## 2024-01-26 MED ORDER — IOHEXOL 300 MG/ML  SOLN
100.0000 mL | Freq: Once | INTRAMUSCULAR | Status: AC | PRN
  Administered 2024-01-26: 100 mL via INTRAVENOUS

## 2024-01-26 NOTE — ED Triage Notes (Signed)
Pt presents via POV c/o left sided flank pain starting this morning. Reports dysuria started on Monday.   Ambulatory to triage. A&O x4.

## 2024-01-26 NOTE — ED Provider Triage Note (Signed)
 Emergency Medicine Provider Triage Evaluation Note  Michele Mclaughlin , a 23 y.o. female  was evaluated in triage.  Pt complains of dysuria, increased frequency over the past week with flank pain that started this morning.  Endorses nausea without vomiting.  No fevers.  Review of Systems  Positive:  Negative:   Physical Exam  BP 101/65 (BP Location: Left Arm)   Pulse 73   Temp 98 F (36.7 C) (Oral)   Resp 16   Ht 5' (1.524 m)   Wt 49.9 kg   LMP 01/06/2024 (Exact Date)   SpO2 100%   BMI 21.48 kg/m  Gen:   Awake, no distress   Resp:  Normal effort  MSK:   Moves extremities without difficulty  Other:  Abdomen without tenderness, positive left CVAT  Medical Decision Making  Medically screening exam initiated at 7:29 PM.  Appropriate orders placed.  Michele Mclaughlin was informed that the remainder of the evaluation will be completed by another provider, this initial triage assessment does not replace that evaluation, and the importance of remaining in the ED until their evaluation is complete.     Donnajean Lynwood DEL, PA-C 01/26/24 1930

## 2024-01-27 ENCOUNTER — Emergency Department (HOSPITAL_COMMUNITY): Payer: Commercial Managed Care - HMO

## 2024-01-27 DIAGNOSIS — N83202 Unspecified ovarian cyst, left side: Secondary | ICD-10-CM | POA: Diagnosis not present

## 2024-01-27 NOTE — ED Provider Notes (Signed)
  EMERGENCY DEPARTMENT AT [facility] Provider Note   CSN: [id number] Arrival date & time: 01/26/24  1825     History  Chief Complaint  Patient presents with   Flank Pain    Michele Mclaughlin is a 23 y.o. female history of generalized seizures presented with left-sided flank pain that began 2 days ago.  Patient denies any fevers abdominal pain nausea vomiting but does endorse some dysuria.  Patient denies hematuria.  Patient denies vaginal bleeding or discharge or concerns for STDs.  Patient denies chest pain shortness of breath.  Patient taking ibuprofen  however states this has not helped.  Home Medications Prior to Admission medications   Medication Sig Start Date End Date Taking? Authorizing Provider  levETIRAcetam  (KEPPRA ) 500 MG tablet Take 1 tablet (500 mg total) by mouth 2 (two) times daily. 07/09/23   Georjean Darice HERO, MD  Multiple Vitamins-Minerals (ONE-A-DAY WOMENS PO) Take by mouth.    [provider]  Norethindrone Acetate-Ethinyl Estrad-FE (HAILEY 24 FE) 1-20 MG-MCG(24) tablet Take 1 tablet by mouth daily.    [provider]  norethindrone-ethinyl estradiol (LOESTRIN) 1-20 MG-MCG tablet Take 1 tablet by mouth daily.    [provider]      Allergies    Patient has no known allergies.    Review of Systems   Review of Systems  Genitourinary:  Positive for flank pain.    Physical Exam Updated Vital Signs BP 101/75 (BP Location: Right Arm)   Pulse 82   Temp 97.8 F (36.6 C) (Oral)   Resp 18   Ht 5' (1.524 m)   Wt 49.9 kg   LMP 01/06/2024 (Exact Date)   SpO2 98%   BMI 21.48 kg/m  Physical Exam Vitals reviewed.  Constitutional:      General: She is not in acute distress. HENT:     Head: Normocephalic and atraumatic.  Eyes:     Extraocular Movements: Extraocular movements intact.     Conjunctiva/sclera: Conjunctivae normal.     Pupils: Pupils are equal, round, and reactive to light.  Cardiovascular:     Rate and  Rhythm: Normal rate and regular rhythm.     Pulses: Normal pulses.     Heart sounds: Normal heart sounds.     Comments: 2+ bilateral radial/dorsalis pedis pulses with regular rate Pulmonary:     Effort: Pulmonary effort is normal. No respiratory distress.     Breath sounds: Normal breath sounds.  Abdominal:     Palpations: Abdomen is soft.     Tenderness: There is no abdominal tenderness. There is no guarding or rebound.  Musculoskeletal:        General: Normal range of motion.     Cervical back: Normal range of motion and neck supple.     Comments: 5 out of 5 bilateral grip/leg extension strength  Skin:    General: Skin is warm and dry.     Capillary Refill: Capillary refill takes less than 2 seconds.  Neurological:     General: No focal deficit present.     Mental Status: She is alert and oriented to person, place, and time.     Comments: Sensation intact in all 4 limbs  Psychiatric:        Mood and Affect: Mood normal.     ED Results / Procedures / Treatments   Labs (all labs ordered are listed, but only abnormal results are displayed) Labs Reviewed  URINALYSIS, ROUTINE W REFLEX MICROSCOPIC - Abnormal; Notable for the  following components:      Result Value   APPearance HAZY (*)    Hgb urine dipstick MODERATE (*)    Ketones, ur 5 (*)    Leukocytes,Ua LARGE (*)    Bacteria, UA RARE (*)    All other components within normal limits  CBC WITH DIFFERENTIAL/PLATELET - Abnormal; Notable for the following components:   WBC 10.6 (*)    All other components within normal limits  HCG, SERUM, QUALITATIVE  COMPREHENSIVE METABOLIC PANEL  LIPASE, BLOOD    EKG None  Radiology US  Pelvis Complete Result Date: 01/27/2024 CLINICAL DATA:  Left adnexal pain. EXAM: TRANSABDOMINAL AND TRANSVAGINAL ULTRASOUND OF PELVIS DOPPLER ULTRASOUND OF OVARIES TECHNIQUE: Both transabdominal and transvaginal ultrasound examinations of the pelvis were performed. Transabdominal technique was performed  for global imaging of the pelvis including uterus, ovaries, adnexal regions, and pelvic cul-de-sac. It was necessary to proceed with endovaginal exam following the transabdominal exam to visualize the ovaries, endometrium and adnexal spaces better. Color and duplex Doppler ultrasound was utilized to evaluate blood flow to the ovaries. COMPARISON:  CT abdomen pelvis with IV contrast yesterday. FINDINGS: Uterus Measurements: Anteverted and slightly anteflexed measuring 7.7 x 4.1 x 4.6 cm = volume: 75.8 mL. No fibroids or other mass visualized. Endometrium Thickness: 1 mm.  No focal abnormality visualized. Right ovary Measurements: 2.6 x 1.2 x 1.5 cm = volume: 2.5 mL. Normal appearance/no adnexal mass. Left ovary Measurements: 3.1 x 2.3 x 2.1 cm = volume: 7.4 mL. Solitary anechoic 2.0 cm uncomplicated thin walled dominant follicle noted but no solid abnormality. No follow-up imaging is recommended. Pulsed Doppler evaluation of both ovaries demonstrates normal low-resistance arterial and venous waveforms. Other findings No abnormal free fluid. IMPRESSION: 2 cm dominant follicle left ovary. No solid abnormality. Otherwise negative pelvic ultrasound. No evidence of ovarian torsion. No fibroids or endometrial thickening. Electronically Signed   By: Francis Quam M.D.   On: 01/27/2024 02:06   US  Transvaginal Non-OB Result Date: 01/27/2024 CLINICAL DATA:  Left adnexal pain. EXAM: TRANSABDOMINAL AND TRANSVAGINAL ULTRASOUND OF PELVIS DOPPLER ULTRASOUND OF OVARIES TECHNIQUE: Both transabdominal and transvaginal ultrasound examinations of the pelvis were performed. Transabdominal technique was performed for global imaging of the pelvis including uterus, ovaries, adnexal regions, and pelvic cul-de-sac. It was necessary to proceed with endovaginal exam following the transabdominal exam to visualize the ovaries, endometrium and adnexal spaces better. Color and duplex Doppler ultrasound was utilized to evaluate blood flow to the  ovaries. COMPARISON:  CT abdomen pelvis with IV contrast yesterday. FINDINGS: Uterus Measurements: Anteverted and slightly anteflexed measuring 7.7 x 4.1 x 4.6 cm = volume: 75.8 mL. No fibroids or other mass visualized. Endometrium Thickness: 1 mm.  No focal abnormality visualized. Right ovary Measurements: 2.6 x 1.2 x 1.5 cm = volume: 2.5 mL. Normal appearance/no adnexal mass. Left ovary Measurements: 3.1 x 2.3 x 2.1 cm = volume: 7.4 mL. Solitary anechoic 2.0 cm uncomplicated thin walled dominant follicle noted but no solid abnormality. No follow-up imaging is recommended. Pulsed Doppler evaluation of both ovaries demonstrates normal low-resistance arterial and venous waveforms. Other findings No abnormal free fluid. IMPRESSION: 2 cm dominant follicle left ovary. No solid abnormality. Otherwise negative pelvic ultrasound. No evidence of ovarian torsion. No fibroids or endometrial thickening. Electronically Signed   By: Francis Quam M.D.   On: 01/27/2024 02:06   US  Art/Ven Flow Abd Pelv Doppler Result Date: 01/27/2024 CLINICAL DATA:  Left adnexal pain. EXAM: TRANSABDOMINAL AND TRANSVAGINAL ULTRASOUND OF PELVIS DOPPLER ULTRASOUND OF OVARIES TECHNIQUE: Both  transabdominal and transvaginal ultrasound examinations of the pelvis were performed. Transabdominal technique was performed for global imaging of the pelvis including uterus, ovaries, adnexal regions, and pelvic cul-de-sac. It was necessary to proceed with endovaginal exam following the transabdominal exam to visualize the ovaries, endometrium and adnexal spaces better. Color and duplex Doppler ultrasound was utilized to evaluate blood flow to the ovaries. COMPARISON:  CT abdomen pelvis with IV contrast yesterday. FINDINGS: Uterus Measurements: Anteverted and slightly anteflexed measuring 7.7 x 4.1 x 4.6 cm = volume: 75.8 mL. No fibroids or other mass visualized. Endometrium Thickness: 1 mm.  No focal abnormality visualized. Right ovary Measurements: 2.6 x 1.2  x 1.5 cm = volume: 2.5 mL. Normal appearance/no adnexal mass. Left ovary Measurements: 3.1 x 2.3 x 2.1 cm = volume: 7.4 mL. Solitary anechoic 2.0 cm uncomplicated thin walled dominant follicle noted but no solid abnormality. No follow-up imaging is recommended. Pulsed Doppler evaluation of both ovaries demonstrates normal low-resistance arterial and venous waveforms. Other findings No abnormal free fluid. IMPRESSION: 2 cm dominant follicle left ovary. No solid abnormality. Otherwise negative pelvic ultrasound. No evidence of ovarian torsion. No fibroids or endometrial thickening. Electronically Signed   By: Francis Quam M.D.   On: 01/27/2024 02:06   CT ABDOMEN PELVIS W CONTRAST Result Date: 01/26/2024 CLINICAL DATA:  Left flank pain EXAM: CT ABDOMEN AND PELVIS WITH CONTRAST TECHNIQUE: Multidetector CT imaging of the abdomen and pelvis was performed using the standard protocol following bolus administration of intravenous contrast. RADIATION DOSE REDUCTION: This exam was performed according to the departmental dose-optimization program which includes automated exposure control, adjustment of the mA and/or kV according to patient size and/or use of iterative reconstruction technique. CONTRAST:  100mL OMNIPAQUE  IOHEXOL  300 MG/ML  SOLN COMPARISON:  None Available. FINDINGS: Lower chest: No acute abnormality. Hepatobiliary: Liver is enlarged. No focal liver abnormality is seen. No gallstones, gallbladder wall thickening, or biliary dilatation. Pancreas: Unremarkable. No pancreatic ductal dilatation or surrounding inflammatory changes. Spleen: Normal in size without focal abnormality. Adrenals/Urinary Tract: Adrenal glands are unremarkable. Kidneys are normal, without renal calculi, focal lesion, or hydronephrosis. Bladder is unremarkable. Stomach/Bowel: Stomach is within normal limits. Appendix is not seen. No evidence of bowel wall thickening, distention, or inflammatory changes. Vascular/Lymphatic: No significant  vascular findings are present. No enlarged abdominal or pelvic lymph nodes. Reproductive: The uterus is within normal limits. There is a 2 cm left adnexal/left ovarian hypodense area which may represent a 6 or complex cyst. The right ovary is not well delineated, but grossly within normal limits. Other: No abdominal wall hernia or abnormality. No abdominopelvic ascites. Musculoskeletal: No acute or significant osseous findings. IMPRESSION: 1. No acute localizing process in the abdomen or pelvis. 2. 2 cm left adnexal/left ovarian hypodense area may represent a hemorrhagic or complex cyst. This can be further evaluated with pelvic ultrasound. 3. Hepatomegaly. Electronically Signed   By: Greig Pique M.D.   On: 01/26/2024 22:39    Procedures Procedures    Medications Ordered in ED Medications  iohexol  (OMNIPAQUE ) 300 MG/ML solution 100 mL (100 mLs Intravenous Contrast Given 01/26/24 2141)    ED Course/ Medical Decision Making/ A&P                                 Medical Decision Making Amount and/or Complexity of Data Reviewed Labs: ordered. Radiology: ordered.   Jameria LOISE Piety 22 y.o. presented today for flank pain. Working DDx that I  considered at this time includes, but not limited to, MSK, nephrolithiasis, pyelonephritis, AAA, aortic dissection, mesenteric ischemia, RCC, obstructive uropathy, renal infarct/hemorrhage, tumor, biliary colic, pancreatitis, appendicitis, SBO, diverticulitis, shingles, lower lobe pneumonia, ectopic pregnancy, PID/TOA, ovarian torsion, endometriosis.  R/o DDx: MSK, nephrolithiasis, pyelonephritis, AAA, aortic dissection, mesenteric ischemia, RCC, obstructive uropathy, renal infarct/hemorrhage, tumor, biliary colic, pancreatitis, appendicitis, SBO, diverticulitis, shingles, lower lobe pneumonia, ectopic pregnancy, PID/TOA, ovarian torsion, endometriosis: These are considered less likely due to history of present illness, physical exam, lab/imaging  findings  Review of prior external notes: 11/25/2023 office visit  Unique Tests and My Independent Interpretation:  CBC: Mild leukocytosis 10.6 CMP: Unremarkable Lipase: Unremarkable UA: Unremarkable Serum hCG qualitative: Negative CT abdomen pelvis with contrast: Left ovarian cyst noted Ultrasound: Left ovarian cyst  Social Determinants of Health: none  Discussion with Independent Historian: None  Discussion of Management of Tests: None  Risk: Low: based on diagnostic testing/clinical impression and treatment plan  Risk Stratification Score: None Plan: On exam patient was in no acute distress stable vitals.. Physical exam showed no acute findings.  Labs and imaging from triage do show mild leukocytosis 10.6 along with 2 cm left ovarian cyst that is most likely causing patient's symptoms.  Pelvic ultrasounds already been ordered and so we will follow through with this.  Patient not requiring any medications at this time.  Patient does see OB/GYN and do suspect that the cyst is causing her pain and that she will need to follow-up.  Ultrasound does not show torsion or other activating emergencies but does show the cyst.  Will patient follow-up with OB/GYN encouraged her to use anti-inflammatories every 6 hours as needed for pain.  Patient was given return precautions. Patient stable for discharge at this time.  Patient verbalized understanding of plan.  This chart was dictated using voice recognition software.  Despite best efforts to proofread,  errors can occur which can change the documentation meaning.         Final Clinical Impression(s) / ED Diagnoses Final diagnoses:  None    Rx / DC Orders ED Discharge Orders     None         Victor Lynwood ONEIDA DEVONNA 01/27/24 0253    Emil Share, DO 01/27/24 [phone]

## 2024-01-27 NOTE — Discharge Instructions (Signed)
 Please follow-up with your OB/GYN in regards to recent ER visit.  Today your labs returned reassuring but you show 2 cm left ovarian follicular cyst is most likely causing your pain.  You may take ibuprofen  or Tylenol  every 6 hours as needed for pain.  If symptoms change or worsen please return to the ER.

## 2024-02-10 ENCOUNTER — Telehealth: Payer: Self-pay | Admitting: Podiatry

## 2024-02-10 NOTE — Telephone Encounter (Signed)
 Patient called 01/13/2024 -- she asked for X-rays for right and left feet but gave no dates  ....   Called 01/13/2024 to confirm dates of x-rays needed -- LMVM .Marland KitchenMarland Kitchen    Called again today (02/10/2024) -- left another message .Marland Kitchen..Marland Kitchen    Two most recent x-rays are the right foot -- 10/28/2023 & 01/13/2024 .Marland Kitchen...    J.Abbott -- 02/10/2024

## 2024-02-10 NOTE — Telephone Encounter (Signed)
 Patient called 01/13/2024 -- asked for X-rays for right and left feet ....   Called 01/02/2024 to confirm dates of x-rays needed -- LMVM .Marland KitchenMarland Kitchen    Called again today (02/10/2024) -- left another message .Marland Kitchen..Marland Kitchen    Two most recent x-rays are the right foot -- 10/28/2023 & 01/13/2024 .Marland Kitchen...    J.Abbott -- 02/10/2024

## 2024-02-11 NOTE — Telephone Encounter (Signed)
 NA

## 2024-02-16 ENCOUNTER — Telehealth: Payer: Self-pay | Admitting: Podiatry

## 2024-02-16 NOTE — Telephone Encounter (Signed)
 Called patient again -- LMVM ....  She requested e-rays but did not specify which foot or which date(s) ....   (See prev notes -- multiple calls attempted) .Marland Kitchen...    J. Abbott -- 02/16/2024

## 2024-02-26 ENCOUNTER — Encounter: Payer: Self-pay | Admitting: Neurology

## 2024-07-03 ENCOUNTER — Ambulatory Visit (INDEPENDENT_AMBULATORY_CARE_PROVIDER_SITE_OTHER): Payer: Commercial Managed Care - HMO | Admitting: Neurology

## 2024-07-03 ENCOUNTER — Encounter: Payer: Self-pay | Admitting: Neurology

## 2024-07-03 VITALS — BP 100/63 | HR 107 | Ht 60.0 in | Wt 107.0 lb

## 2024-07-03 DIAGNOSIS — G40309 Generalized idiopathic epilepsy and epileptic syndromes, not intractable, without status epilepticus: Secondary | ICD-10-CM

## 2024-07-03 MED ORDER — LEVETIRACETAM 500 MG PO TABS: 500.0000 mg | ORAL_TABLET | Freq: Two times a day (BID) | ORAL | 4 refills | Status: AC

## 2024-07-03 NOTE — Patient Instructions (Signed)
 It's always a pleasure to see you. Continue Keppra  (Levetiracetam ) 500mg  twice a day. Follow-up in 1 year, call for any changes.    Seizure Precautions: 1. If medication has been prescribed for you to prevent seizures, take it exactly as directed.  Do not stop taking the medicine without talking to your doctor first, even if you have not had a seizure in a long time.   2. Avoid activities in which a seizure would cause danger to yourself or to others.  Don't operate dangerous machinery, swim alone, or climb in high or dangerous places, such as on ladders, roofs, or girders.  Do not drive unless your doctor says you may.  3. If you have any warning that you may have a seizure, lay down in a safe place where you can't hurt yourself.    4.  No driving for 6 months from last seizure, as per [place]  state law.   Please refer to the following link on the [employer]'s website for more information: http://www.epilepsyfoundation.org/answerplace/Social/driving/drivingu.cfm   5.  Maintain good sleep hygiene. Avoid alcohol.  6.  Notify your neurology if you are planning pregnancy or if you become pregnant.  7.  Contact your doctor if you have any problems that may be related to the medicine you are taking.  8.  Call 911 and bring the patient back to the ED if:        A.  The seizure lasts longer than 5 minutes.       B.  The patient doesn't awaken shortly after the seizure  C.  The patient has new problems such as difficulty seeing, speaking or moving  D.  The patient was injured during the seizure  E.  The patient has a temperature over 102 F (39C)  F.  The patient vomited and now is having trouble breathing

## 2024-07-03 NOTE — Progress Notes (Signed)
 NEUROLOGY FOLLOW UP OFFICE NOTE  Michele Mclaughlin [id number] 08/25/2001  HISTORY OF PRESENT ILLNESS: I had the pleasure of seeing Michele Mclaughlin in follow-up in the neurology clinic on 07/03/2024.  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 She is alone in the office today.  Records and images were personally reviewed where available.  Since her last visit, she continues to do well seizure-free since 12/25/2021 on Levetiracetam  500mg  BID, no side effects. She denies any staring/unresponsive episodes, gaps in time, olfactory/gustatory hallucinations, focal numbness/tingling/weakness, myoclonic jerks. No headaches, dizziness, vision changes, no falls. She gets 6-7 hours of sleep. Mood is good. She is an EMT. No pregnancy plans, she is on an OCP. She provides additional information today that her father may have undiagnosed seizures.   History on Initial Assessment 07/31/2021: This is a pleasant 23 year old right-handed woman with a history of seizures and syncope presenting for evaluation. She was previously evaluated by pediatric neurologist Dr. Corinthia for seizure-like activity. The first episode occurred in April 2019 when her mother heard a nosie from her room and found her confused and disoriented for 2 minutes, no tonic-clonic activity noted. The second episode occurred June 2019 when again her parents heard a noise and found her on the floor with tongue bite and confusion for about 2 minutes, no motor activity noted. She had another event in July 2019 where she was witnessed to have behavioral arrest then became tight and stiff, not responding so they lay her on the floor where she was noted to have stiffening more on the left arm with flexion of both upper extremities and jaw stiffening for 4-5 minutes followed by confusion. She had an EEG in July 2019 with note of 1 very brief cluster of generalized discharges in wakefulness. Repeat EEG that month showed 2 brief clusters of  generalized discharges in the form of spike and slow wave activity lasting less than 1 second. She was prescribed Keppra  but did not take it. She was event-free until January 2022 when she recalls taking a shower early in the morning then waking up sitting on the carpet with blood, confused. She found a gash on her head that required staples. The most recent episode occurred on 06/19/21, she was in her bedroom then woke up in her parents' bedroom, apparently her brother found her passed out on the floor, no tongue bite or incontinence. She was a little stiff, no focal weakness, headaches, dizziness. The day prior was very stressful for her, her grandmother had passed away and her sister had given birth. She lives with her parents and 2 brothers who have not mentioned any staring/unresponsive episodes. She denies any gaps in time, olfactory/gustatory hallucinations, deja vu, rising epigastric sensation, focal numbness/tingling/weakness, myoclonic jerks.No headaches, dizziness, diplopia, dysarthria/dysphagia, neck/back pain, bowel/bladder dysfunction. Memory is pretty good. She had been evaluated by Cardiologist with plans for workup, however she is seeking a second opinion. 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   Epilepsy Risk Factors:  She had a normal birth and early development.  There is no history of febrile convulsions, CNS infections such as meningitis/encephalitis, significant traumatic brain injury, neurosurgical procedures, or family history of seizures.  Diagnostic Data: MRI brain with and without contrast done 07/2021 normal, hippocampi symmetric with no abnormal signal or enhancement.  EEG in 07/2021 showed occasional bursts of generalized irregular high voltage 3-4 Hz spike and wave discharges with frontal predominance.   PAST MEDICAL HISTORY:  Past Medical History:  Diagnosis Date   Generalized seizures (HCC)     MEDICATIONS: Current Outpatient Medications on File Prior to  Visit  Medication Sig Dispense Refill   levETIRAcetam  (KEPPRA ) 500 MG tablet Take 1 tablet (500 mg total) by mouth 2 (two) times daily. 180 tablet 3   Multiple Vitamins-Minerals (ONE-A-DAY WOMENS PO) Take by mouth.     Norethindrone Acetate-Ethinyl Estrad-FE (HAILEY 24 FE) 1-20 MG-MCG(24) tablet Take 1 tablet by mouth daily.     norethindrone-ethinyl estradiol (LOESTRIN) 1-20 MG-MCG tablet Take 1 tablet by mouth daily.     No current facility-administered medications on file prior to visit.    ALLERGIES: No Known Allergies  FAMILY HISTORY: Family History  Problem Relation Age of Onset   Hypertension Father    Arrhythmia Father        Afib   Aortic aneurysm Father    Migraines Neg Hx    Autism Neg Hx    ADD / ADHD Neg Hx    Anxiety disorder Neg Hx    Depression Neg Hx    Bipolar disorder Neg Hx    Schizophrenia Neg Hx     SOCIAL HISTORY: Social History   Socioeconomic History   Marital status: Single    Spouse name: Not on file   Number of children: Not on file   Years of education: Not on file   Highest education level: Not on file  Occupational History   Not on file  Tobacco Use   Smoking status: Never   Smokeless tobacco: Never  Vaping Use   Vaping status: Never Used  Substance and Sexual Activity   Alcohol use: Never   Drug use: Never   Sexual activity: Not on file  Other Topics Concern   Not on file  Social History Narrative   Lives with mom, dad, two brothers. She is going into the 11th grade and is home schooled. She enjoys baby sitting, running, and swimming   Right handed    Caffeine   Has 5 brothers and one sister   Social Drivers of Corporate investment banker Strain: Not on file  Food Insecurity: No Food Insecurity (06/03/2021)   Received from [facility]   Hunger Vital Sign    Worried About Running Out of Food in the Last Year: Never true    Ran Out of Food in the Last Year: Never true  Transportation Needs: Not on file  Physical  Activity: Not on file  Stress: Not on file  Social Connections: Unknown (05/05/2022)   Received from [facility]   Social Network    Social Network: Not on file  Intimate Partner Violence: Unknown (03/27/2022)   Received from [facility]   HITS    Physically Hurt: Not on file    Insult or Talk Down To: Not on file    Threaten Physical Harm: Not on file    Scream or Curse: Not on file     PHYSICAL EXAM: Vitals:   07/03/24 0817  BP: 100/63  Pulse: (!) 107   General: No acute distress Head:  Normocephalic/atraumatic Skin/Extremities: No rash, no edema Neurological Exam: alert and awake. No aphasia or dysarthria. Fund of knowledge is appropriate.  Attention and concentration are normal.   Cranial nerves: Pupils equal, round. Extraocular movements intact with no nystagmus. Visual fields full.  No facial asymmetry.  Motor: Bulk and tone normal, muscle strength 5/5 throughout with no pronator drift.   Finger to nose testing intact.  Gait  narrow-based and steady, able to tandem walk adequately.  Romberg negative.   IMPRESSION: This is a very pleasant 23 yo RH woman with primary generalized epilepsy. She has been seizure-free since 12/2021 on Levetiracetam  500mg  BID, refills sent. We again discussed avoidance of seizure triggers. No pregnancy plans. She is aware of [place] driving laws to stop driving after a seizure until 6 months seizure-free. Follow-up in 1 year, call for any changes.   Thank you for allowing me to participate in her care.  Please do not hesitate to call for any questions or concerns.    Darice Shivers, M.D.   CC: Dr. Silverio

## 2024-08-11 ENCOUNTER — Telehealth: Payer: Self-pay | Admitting: Neurology

## 2024-08-11 NOTE — Telephone Encounter (Signed)
 On Dr. Cyrilla Desk 08/11/24

## 2024-08-11 NOTE — Telephone Encounter (Signed)
 Pt. Needs signature on note, left inbox

## 2024-08-14 NOTE — Telephone Encounter (Signed)
 Pt. Would like once Paper work filled out to be called by phone, thanks.SABRAxplnd It can take 2 weeks or less for completion

## 2024-08-18 ENCOUNTER — Encounter: Payer: Self-pay | Admitting: Neurology

## 2024-08-18 ENCOUNTER — Telehealth: Payer: Self-pay | Admitting: Neurology

## 2024-08-18 NOTE — Telephone Encounter (Signed)
 Pt is returning a call back due to she stated that Dr. Had questions about the paperwork that was lefted here to be filled out. Thanks

## 2024-08-22 ENCOUNTER — Telehealth: Payer: Self-pay | Admitting: Neurology

## 2024-08-22 NOTE — Telephone Encounter (Signed)
 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 He states there are EMTs (and Psychologist, prison and probation services) who are diabetics, epileptics, and as long as they are seeing their physician on a prescribed regimen and doing well on this, they can drive an ambulance. If something happens, they are put on light duty. I asked about need for accommodations (ie shift changes, breaks), he states that EMS has changed their schedule to accommodate this already, they work 8 to 12 hour shifts on a 2-3 day regimen, then they get 2-5 days off. There is an 8-hour period of rest between shifts. He was her instructor and she went through all her clinicals and skill assessments without any concerns.

## 2024-11-08 ENCOUNTER — Other Ambulatory Visit: Payer: Self-pay

## 2024-11-08 ENCOUNTER — Encounter: Payer: Self-pay | Admitting: Neurology

## 2024-11-08 ENCOUNTER — Emergency Department (HOSPITAL_COMMUNITY)
Admission: EM | Admit: 2024-11-08 | Discharge: 2024-11-08 | Disposition: A | Discharge status: Home / Self Care | Attending: Emergency Medicine | Admitting: Emergency Medicine

## 2024-11-08 ENCOUNTER — Encounter (HOSPITAL_COMMUNITY): Payer: Self-pay | Admitting: *Deleted

## 2024-11-08 DIAGNOSIS — R55 Syncope and collapse: Secondary | ICD-10-CM | POA: Insufficient documentation

## 2024-11-08 LAB — COMPREHENSIVE METABOLIC PANEL WITH GFR
ALT: 8 U/L (ref 0–44)
AST: 19 U/L (ref 15–41)
Albumin: 4.5 g/dL (ref 3.5–5.0)
Alkaline Phosphatase: 78 U/L (ref 38–126)
Anion gap: 11 (ref 5–15)
BUN: 10 mg/dL (ref 6–20)
CO2: 25 mmol/L (ref 22–32)
Calcium: 9.4 mg/dL (ref 8.9–10.3)
Chloride: 105 mmol/L (ref 98–111)
Creatinine, Ser: 0.74 mg/dL (ref 0.44–1.00)
GFR, Estimated: >60 mL/min (ref >60)
Glucose, Bld: 98 mg/dL (ref 70–99)
Potassium: 3.4 mmol/L — ABNORMAL LOW (ref 3.5–5.1)
Sodium: 140 mmol/L (ref 135–145)
Total Bilirubin: 0.6 mg/dL (ref 0.0–1.2)
Total Protein: 7.3 g/dL (ref 6.5–8.1)

## 2024-11-08 LAB — URINALYSIS, ROUTINE W REFLEX MICROSCOPIC
Bacteria, UA: NONE SEEN
Bilirubin Urine: NEGATIVE
Glucose, UA: NEGATIVE mg/dL
Ketones, ur: NEGATIVE mg/dL
Leukocytes,Ua: NEGATIVE
Nitrite: NEGATIVE
Protein, ur: NEGATIVE mg/dL
Specific Gravity, Urine: 1.013 (ref 1.005–1.030)
pH: 6 (ref 5.0–8.0)

## 2024-11-08 LAB — CBC
HCT: 41.3 % (ref 36.0–46.0)
Hemoglobin: 14.1 g/dL (ref 12.0–15.0)
MCH: 29.9 pg (ref 26.0–34.0)
MCHC: 34.1 g/dL (ref 30.0–36.0)
MCV: 87.7 fL (ref 80.0–100.0)
Platelets: 280 K/uL (ref 150–400)
RBC: 4.71 MIL/uL (ref 3.87–5.11)
RDW: 11.9 % (ref 11.5–15.5)
WBC: 9.4 K/uL (ref 4.0–10.5)
nRBC: 0 % (ref 0.0–0.2)

## 2024-11-08 LAB — URINE DRUG SCREEN
Amphetamines: NEGATIVE
Barbiturates: NEGATIVE
Benzodiazepines: NEGATIVE
Cocaine: NEGATIVE
Fentanyl: NEGATIVE
Methadone Scn, Ur: NEGATIVE
Opiates: NEGATIVE
Tetrahydrocannabinol: NEGATIVE

## 2024-11-08 LAB — HCG, SERUM, QUALITATIVE: Preg, Serum: NEGATIVE

## 2024-11-08 LAB — CBG MONITORING, ED: Glucose-Capillary: 91 mg/dL (ref 70–99)

## 2024-11-08 NOTE — ED Triage Notes (Signed)
 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 Pt is having a heavier menstrual cycle at present times, no other symptoms. CBG 116-122/[id number] RA # [id number] left hand.

## 2024-11-08 NOTE — ED Notes (Signed)
 Urine specimen sent to lab

## 2024-11-08 NOTE — Discharge Instructions (Signed)
 Thank you for letting us  evaluate you today.  Your lab work is unremarkable.  You have no significant electrolyte derangements.  You are not pregnant.  Your EKG does not show any acute coronary syndrome.  Your urine does not show any infection it does have blood which is likely secondary to your current menses.  Your UDS is negative for drugs.  Return to Emergency Department if you experience loss of consciousness, seizure-like activity, severe onset of the worst headache of your life, dizziness especially at rest, worsening symptoms.  Otherwise, can follow-up with PCP

## 2024-11-08 NOTE — ED Notes (Signed)
 Pt ambulated successfully without assistance, steady gait and balance.

## 2024-11-08 NOTE — ED Provider Notes (Signed)
 [place] EMERGENCY DEPARTMENT AT [facility] Provider Note   CSN: [id number] Arrival date & time: 11/08/24  9055     Patient presents with: Near Syncope   Michele Mclaughlin is a 23 y.o. female with a past medical history of epilepsy (on Keppra  twice daily) presents Emergency Department for evaluation of presyncope.  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  Reports that she was standing in 1 place with her knees locked for approximately 10 minutes.  She suddenly then had tinnitus and was told that she was cyanotic by by standards.  She was able to get to a chair and did not fall.  No LOC or seizure-like activity per bystanders.  She is able to recall entire event.  Vital signs were obtained at that time notable for 100/60 mmHg, 50 bpm, 99% O2.  Had no complaints of headache, CP, SHOB.  Did not eat this morning and occasionally does not eat in the morning but did have some water today.  Last ate a substantial meal last evening.  Reports that she is on day 3 of her menses and they normally last 5-7 days but this menses is heavier than usual.  Has menses once every month with LMP 10/09/2024 that lasted 6 days.  Has no complaints prior to incident today other than feeling scratchy throat last night when she took NyQuil.  Woke up this morning feeling at baseline.  Denies headache, dizziness, lightheadedness, recent travel, new medication, hemoptysis, OCP, pedal edema, history of DVT/PE, cp, fevers, NV.  Of note, does have history of epilepsy but took her Keppra  this morning.  Is compliant and has not missed a dose.    Near Syncope       Prior to Admission medications   Medication Sig Start Date End Date Taking? Authorizing Provider  levETIRAcetam  (KEPPRA ) 500 MG tablet Take 1 tablet (500 mg total) by mouth 2 (two) times daily. 07/03/24   Georjean Darice HERO, MD  Multiple Vitamins-Minerals  (ONE-A-DAY WOMENS PO) Take by mouth.    [provider]  Norethindrone Acetate-Ethinyl Estrad-FE (HAILEY 24 FE) 1-20 MG-MCG(24) tablet Take 1 tablet by mouth daily.    [provider]  norethindrone-ethinyl estradiol (LOESTRIN) 1-20 MG-MCG tablet Take 1 tablet by mouth daily.    [provider]    Allergies: Patient has no known allergies.    Review of Systems  Cardiovascular:  Positive for near-syncope.    Updated Vital Signs BP 113/77   Pulse 68   Temp 98.2 F (36.8 C)   Resp 18   Ht 5' (1.524 m)   Wt 49.9 kg   LMP 11/07/2024   SpO2 98%   BMI 21.48 kg/m   Physical Exam Vitals and nursing note reviewed.  Constitutional:      General: She is not in acute distress.    Appearance: Normal appearance.  HENT:     Head: Normocephalic and atraumatic.  Eyes:     General: Lids are normal. Vision grossly intact. No visual field deficit.    Extraocular Movements:     Right eye: Normal extraocular motion and no nystagmus.     Left eye: Normal extraocular motion and no nystagmus.     Conjunctiva/sclera: Conjunctivae normal.     Comments: Pupils PERRLA  Cardiovascular:     Rate and Rhythm: Normal rate.  Pulmonary:     Effort: Pulmonary effort is  normal. No respiratory distress.  Abdominal:     Tenderness: There is no abdominal tenderness.  Musculoskeletal:     Cervical back: Normal range of motion and neck supple. No rigidity.  Skin:    Coloration: Skin is not jaundiced or pale.  Neurological:     Mental Status: She is alert and oriented to person, place, and time. Mental status is at baseline.     GCS: GCS eye subscore is 4. GCS verbal subscore is 5. GCS motor subscore is 6.     Cranial Nerves: No cranial nerve deficit, dysarthria or facial asymmetry.     Sensory: Sensation is intact. No sensory deficit.     Motor: No weakness, tremor, atrophy, abnormal muscle tone, seizure activity or pronator drift.     Coordination: Coordination normal.  Finger-Nose-Finger Test and Heel to Oxford Test normal.     Gait: Gait is intact.     Comments: A&Ox3. No nystagmus. Motor 5/5 and sensation 2/2 of BUE and BLE.  No speech deficits.  Vision grossly intact.  No agnosia     (all labs ordered are listed, but only abnormal results are displayed) Labs Reviewed  COMPREHENSIVE METABOLIC PANEL WITH GFR - Abnormal; Notable for the following components:      Result Value   Potassium 3.4 (*)    All other components within normal limits  URINALYSIS, ROUTINE W REFLEX MICROSCOPIC - Abnormal; Notable for the following components:   Hgb urine dipstick SMALL (*)    All other components within normal limits  HCG, SERUM, QUALITATIVE  CBC  URINE DRUG SCREEN  CBG MONITORING, ED    EKG: EKG Interpretation Date/Time:  Wednesday November 08 2024 16:27:25 EST Ventricular Rate:  74 PR Interval:  154 QRS Duration:  66 QT Interval:  384 QTC Calculation: 426 R Axis:   86  Text Interpretation: Sinus rhythm Abnrm T, consider ischemia, anterolateral lds ST elev, probable normal early repol pattern No significant change since last tracing Confirmed by Patt Alm DEL [phone]) on 11/08/2024 4:39:29 PM  Radiology: No results found.    Medications Ordered in the ED - No data to display  Clinical Course as of 11/08/24 2316  Wed Nov 08, 2024  1743 Hgb urine dipstick(!): SMALL Currently on menses. No flank pain nor hx of stones. No signs of infection nor UTI [LB]    Clinical Course User Index [LB] Minnie Tinnie BRAVO, PA                                 Medical Decision Making Amount and/or Complexity of Data Reviewed Labs: ordered. Decision-making details documented in ED Course.   Patient presents to the ED for concern of presyncope, this involves an extensive number of treatment options, and is a complaint that carries with it a high risk of complications and morbidity.  The differential diagnosis includes ACS, PE, electrolyte abnormality, drug toxidrome,  infection, dehydration, intracranial abnormality, pregnancy, symptomatic anemia   Co morbidities that complicate the patient evaluation  History of seizures on Keppra    Additional history obtained:  Additional history obtained from Nursing   External records from outside source obtained and reviewed including triage RN note   Lab Tests:  I Ordered, and personally interpreted labs.  The pertinent results include:   Potassium 3.4 UA with small Hgb but no signs of infection UDS negative    Cardiac Monitoring:  The patient was maintained on a cardiac monitor.  I personally viewed and interpreted the cardiac monitored which showed an underlying rhythm of: NSR with no ST or T wave abnormalities    Problem List / ED Course:  Presyncope Vital signs hemodynamically stable with no fever no tachycardia Labs without acute abnormalities.  Neuro intact Did have prodrome and followed prolonged standing in 1 place with a LOC.  No chest pain, nausea, vomiting.  No history of cardiac etiology.  Does not seem consistent with cardiac presyncope/syncope.  EKG without ST nor T wave abnormalities.  Low suspicion for ACS Has absolutely no risk factors for PE.  No tachycardia nor hypoxia.  PERC negative. Never had a headache.  Was witnessed.  No LOC.  No seizure-like activity.  Is compliant with Keppra .  Low suspicion for seizure hCG negative Hemoglobin stable.  No signs of symptomatic anemia in setting of heavy menses. UA without infection.  UDS negative Symptoms likely secondary to standing and prolonged place with knees locked, current menses, no food this morning which could make her more susceptible for a presyncopal event.  No signs of traumatic injury.  I offered her IVF however she refused. Reports that she is back to baseline with absolutely no complaints.  No syncope while in ED. Orthostatics negative Pt ambulated around ED with no complaints.  Able to do so without weakness or  difficulty.  Is asking to be discharged.   Reevaluation:  After the interventions noted above, I reevaluated the patient and found that they have :resolved    Dispostion:  After consideration of the diagnostic results and the patients response to treatment, I feel that the patent would benefit from outpatient management PCP follow-up as needed.   Discussed ED workup, disposition, return to ED precautions with patient who expresses understanding agrees with plan.  All questions answered to their satisfaction.  They are agreeable to plan.  Discharge instructions provided on paperwork  Final diagnoses:  Near syncope    ED Discharge Orders     None        Minnie Tinnie BRAVO, PA 11/08/24 2318    Patt Alm Macho, MD 11/08/24 [phone]

## 2024-11-10 NOTE — ED Notes (Signed)
 11/10/24 1735 pt requesting a work note. Note provided.

## 2025-07-03 ENCOUNTER — Ambulatory Visit: Admitting: Neurology
